# Patient Record
Sex: Male | Born: 1985 | Race: Black or African American | Hispanic: No | Marital: Single | Smoking: Current every day smoker
Health system: Southern US, Community
[De-identification: ages and names within clinical notes are randomized; demographics above are authoritative.]

## PROBLEM LIST (undated history)

## (undated) DIAGNOSIS — K649 Unspecified hemorrhoids: Secondary | ICD-10-CM

## (undated) DIAGNOSIS — F419 Anxiety disorder, unspecified: Secondary | ICD-10-CM

## (undated) DIAGNOSIS — G43909 Migraine, unspecified, not intractable, without status migrainosus: Secondary | ICD-10-CM

## (undated) HISTORY — PX: HEMORRHOID SURGERY: SHX153

---
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
## 2006-03-30 ENCOUNTER — Emergency Department (HOSPITAL_COMMUNITY): Admission: EM | Admit: 2006-03-30 | Discharge: 2006-03-30 | Payer: Self-pay | Admitting: Emergency Medicine

## 2007-02-13 ENCOUNTER — Emergency Department (HOSPITAL_COMMUNITY): Admission: EM | Admit: 2007-02-13 | Discharge: 2007-02-13 | Payer: Self-pay | Admitting: Emergency Medicine

## 2007-11-22 ENCOUNTER — Emergency Department (HOSPITAL_COMMUNITY): Admission: EM | Admit: 2007-11-22 | Discharge: 2007-11-22 | Payer: Self-pay | Admitting: Emergency Medicine

## 2008-01-08 ENCOUNTER — Encounter (INDEPENDENT_AMBULATORY_CARE_PROVIDER_SITE_OTHER): Payer: Self-pay | Admitting: General Surgery

## 2008-01-08 ENCOUNTER — Ambulatory Visit (HOSPITAL_COMMUNITY): Admission: EM | Admit: 2008-01-08 | Discharge: 2008-01-08 | Payer: Self-pay | Admitting: Emergency Medicine

## 2008-06-03 ENCOUNTER — Emergency Department (HOSPITAL_COMMUNITY): Admission: EM | Admit: 2008-06-03 | Discharge: 2008-06-03 | Payer: Self-pay | Admitting: Emergency Medicine

## 2008-12-02 ENCOUNTER — Emergency Department (HOSPITAL_COMMUNITY): Admission: EM | Admit: 2008-12-02 | Discharge: 2008-12-03 | Payer: Self-pay | Admitting: Emergency Medicine

## 2009-07-12 ENCOUNTER — Emergency Department (HOSPITAL_COMMUNITY): Admission: EM | Admit: 2009-07-12 | Discharge: 2009-07-12 | Payer: Self-pay | Admitting: Emergency Medicine

## 2009-09-13 ENCOUNTER — Emergency Department (HOSPITAL_COMMUNITY): Admission: EM | Admit: 2009-09-13 | Discharge: 2009-09-13 | Payer: Self-pay | Admitting: Emergency Medicine

## 2010-08-14 LAB — COMPREHENSIVE METABOLIC PANEL
ALT: 15 U/L (ref 0–53)
AST: 21 U/L (ref 0–37)
Alkaline Phosphatase: 52 U/L (ref 39–117)
BUN: 10 mg/dL (ref 6–23)
Calcium: 9.5 mg/dL (ref 8.4–10.5)
GFR calc Af Amer: >60 mL/min (ref >60)
GFR calc non Af Amer: >60 mL/min (ref >60)
Potassium: 3.5 mEq/L (ref 3.5–5.1)
Total Bilirubin: 0.3 mg/dL (ref 0.3–1.2)

## 2010-08-14 LAB — DIFFERENTIAL
Basophils Absolute: 0 10*3/uL (ref 0.0–0.1)
Eosinophils Absolute: 0.4 10*3/uL (ref 0.0–0.7)
Eosinophils Relative: 3 % (ref 0–5)
Lymphocytes Relative: 23 % (ref 12–46)
Lymphs Abs: 3.1 10*3/uL (ref 0.7–4.0)
Monocytes Absolute: 0.6 10*3/uL (ref 0.1–1.0)
Neutro Abs: 9.3 10*3/uL — ABNORMAL HIGH (ref 1.7–7.7)
Neutrophils Relative %: 69 % (ref 43–77)

## 2010-08-14 LAB — CBC
HCT: 40.8 % (ref 39.0–52.0)
MCV: 100.4 fL — ABNORMAL HIGH (ref 78.0–100.0)
Platelets: 270 10*3/uL (ref 150–400)
RDW: 13.2 % (ref 11.5–15.5)

## 2010-08-14 LAB — URINE CULTURE

## 2010-08-14 LAB — URINALYSIS, ROUTINE W REFLEX MICROSCOPIC

## 2010-08-14 LAB — RAPID URINE DRUG SCREEN, HOSP PERFORMED
Amphetamines: NOT DETECTED
Benzodiazepines: NOT DETECTED
Cocaine: NOT DETECTED

## 2010-08-14 LAB — URINE MICROSCOPIC-ADD ON

## 2010-08-14 LAB — ETHANOL: Alcohol, Ethyl (B): <5 mg/dL (ref 0–10)

## 2010-08-22 LAB — BASIC METABOLIC PANEL
CO2: 17 mEq/L — ABNORMAL LOW (ref 19–32)
Chloride: 99 mEq/L (ref 96–112)
Creatinine, Ser: 1.38 mg/dL (ref 0.4–1.5)
GFR calc Af Amer: >60 mL/min (ref >60)
GFR calc non Af Amer: >60 mL/min (ref >60)
Glucose, Bld: 81 mg/dL (ref 70–99)
Potassium: 4.3 mEq/L (ref 3.5–5.1)
Sodium: 135 mEq/L (ref 135–145)

## 2010-08-22 LAB — DIFFERENTIAL
Basophils Relative: 0 % (ref 0–1)
Eosinophils Absolute: 0.1 10*3/uL (ref 0.0–0.7)
Lymphocytes Relative: 25 % (ref 12–46)
Lymphs Abs: 3 10*3/uL (ref 0.7–4.0)
Monocytes Relative: 6 % (ref 3–12)

## 2010-08-22 LAB — CBC
Platelets: 271 10*3/uL (ref 150–400)
WBC: 12.1 10*3/uL — ABNORMAL HIGH (ref 4.0–10.5)

## 2010-08-22 LAB — URINALYSIS, ROUTINE W REFLEX MICROSCOPIC
Glucose, UA: NEGATIVE mg/dL
Ketones, ur: >80 mg/dL — AB
Specific Gravity, Urine: >1.03 — ABNORMAL HIGH (ref 1.005–1.030)

## 2010-08-22 LAB — URINE MICROSCOPIC-ADD ON

## 2010-09-20 NOTE — Op Note (Signed)
Jesus Adams, Jesus Adams                 ACCOUNT NO.:  [id number]   MEDICAL RECORD NO.:  [id number]          PATIENT TYPE:  AMB   LOCATION:  DAY                           FACILITY:  [facility]   PHYSICIAN:  Dalia Heading, M.D.  DATE OF BIRTH:  12-23-1985   DATE OF PROCEDURE:  01/08/2008  DATE OF DISCHARGE:                               OPERATIVE REPORT   PREOPERATIVE DIAGNOSIS:  Protruding hemorrhoidal disease.   POSTOPERATIVE DIAGNOSIS:  Protruding hemorrhoidal disease.   PROCEDURE:  Extensive hemorrhoidectomy.   SURGEON:  Dalia Heading, MD   ANESTHESIA:  General endotracheal.   INDICATIONS:  The patient is a 25 year old black male who presents with  a 1-week history of worsening prolapsing hemorrhoidal disease.  He  states he has a history of constipation in the past.  The risks and  benefits of the procedure including bleeding, infection, and recurrence  of hemorrhoidal disease were fully explained to the patient, gave  informed consent.   PROCEDURE NOTE:  The patient was placed in the lithotomy position after  induction of general endotracheal anesthesia.  The perineum was prepped  and draped using the usual sterile technique with Betadine.  Surgical  site confirmation was performed.   The patient had significant circumferential prolapsing hemorrhoidal  disease as well as some mucosal prolapse.  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  Using LigaSure, a circumferential hemorrhoidectomy  was performed along with mucosal resection.  Any loose edges were then  reapproximated using 2-0 Vicryl interrupted sutures.  Care was taken to  avoid the external sphincter mechanism.  The surrounding perineum was  instilled with 0.5% Sensorcaine.  Surgicel and viscous Xylocaine packing  was then placed into the rectum.   All tape and needle counts were correct at the end of the procedure.  The patient was  extubated in the operating room, went back to recovery  room awake in stable condition.   COMPLICATIONS:  None.   SPECIMEN:  Hemorrhoids.   ESTIMATED BLOOD LOSS:  Minimal.      Dalia Heading, M.D.  Electronically Signed     MAJ/MEDQ  D:  01/08/2008  T:  01/09/2008  Job:  [id number]

## 2010-10-09 ENCOUNTER — Emergency Department (HOSPITAL_COMMUNITY): Payer: Self-pay

## 2010-10-09 ENCOUNTER — Emergency Department (HOSPITAL_COMMUNITY)
Admission: EM | Admit: 2010-10-09 | Discharge: 2010-10-09 | Disposition: A | Discharge status: Home / Self Care | Payer: Self-pay | Attending: Emergency Medicine | Admitting: Emergency Medicine

## 2010-10-09 DIAGNOSIS — N390 Urinary tract infection, site not specified: Secondary | ICD-10-CM | POA: Insufficient documentation

## 2010-10-09 DIAGNOSIS — F17200 Nicotine dependence, unspecified, uncomplicated: Secondary | ICD-10-CM | POA: Insufficient documentation

## 2010-10-09 LAB — CBC
HCT: 40.2 % (ref 39.0–52.0)
Hemoglobin: 13.8 g/dL (ref 13.0–17.0)
MCHC: 34.3 g/dL (ref 30.0–36.0)
MCV: 94.1 fL (ref 78.0–100.0)
RBC: 4.27 MIL/uL (ref 4.22–5.81)
RDW: 13.2 % (ref 11.5–15.5)
WBC: 9.3 10*3/uL (ref 4.0–10.5)

## 2010-10-09 LAB — URINALYSIS, ROUTINE W REFLEX MICROSCOPIC
Bilirubin Urine: NEGATIVE
Glucose, UA: NEGATIVE mg/dL
Leukocytes, UA: NEGATIVE
Specific Gravity, Urine: 1.02 (ref 1.005–1.030)
pH: 6.5 (ref 5.0–8.0)

## 2010-10-09 LAB — COMPREHENSIVE METABOLIC PANEL
Alkaline Phosphatase: 72 U/L (ref 39–117)
BUN: 11 mg/dL (ref 6–23)
CO2: 29 mEq/L (ref 19–32)
Calcium: 10 mg/dL (ref 8.4–10.5)
GFR calc non Af Amer: >60 mL/min (ref >60)
Glucose, Bld: 79 mg/dL (ref 70–99)
Total Bilirubin: 0.4 mg/dL (ref 0.3–1.2)

## 2010-10-09 LAB — DIFFERENTIAL
Basophils Absolute: 0 10*3/uL (ref 0.0–0.1)
Eosinophils Relative: 5 % (ref 0–5)
Monocytes Absolute: 0.4 10*3/uL (ref 0.1–1.0)
Neutrophils Relative %: 50 % (ref 43–77)

## 2010-10-09 LAB — URINE MICROSCOPIC-ADD ON

## 2010-10-11 LAB — URINE CULTURE: Culture: NO GROWTH

## 2010-11-12 ENCOUNTER — Emergency Department (HOSPITAL_COMMUNITY)
Admission: EM | Admit: 2010-11-12 | Discharge: 2010-11-12 | Discharge status: Against Medical Advice | Payer: Self-pay | Attending: Emergency Medicine | Admitting: Emergency Medicine

## 2010-11-12 DIAGNOSIS — Z0389 Encounter for observation for other suspected diseases and conditions ruled out: Secondary | ICD-10-CM | POA: Insufficient documentation

## 2010-11-24 ENCOUNTER — Emergency Department: Payer: Self-pay | Admitting: Emergency Medicine

## 2011-02-03 LAB — RAPID URINE DRUG SCREEN, HOSP PERFORMED
Amphetamines: NOT DETECTED
Benzodiazepines: NOT DETECTED
Tetrahydrocannabinol: POSITIVE — AB

## 2011-02-03 LAB — COMPREHENSIVE METABOLIC PANEL
ALT: 15
AST: 20
Alkaline Phosphatase: 61
CO2: 21
Chloride: 109
Creatinine, Ser: 0.99
GFR calc Af Amer: >60
GFR calc non Af Amer: >60
Potassium: 3.8
Sodium: 141
Total Bilirubin: 0.6

## 2011-02-03 LAB — URINALYSIS, ROUTINE W REFLEX MICROSCOPIC
Bilirubin Urine: NEGATIVE
Ketones, ur: NEGATIVE
Nitrite: NEGATIVE
Urobilinogen, UA: 0.2

## 2011-02-03 LAB — DIFFERENTIAL
Basophils Absolute: 0
Basophils Relative: 0
Eosinophils Absolute: 0.4
Eosinophils Relative: 4

## 2011-02-03 LAB — LIPASE, BLOOD: Lipase: 20

## 2011-02-03 LAB — CBC
MCV: 98
RBC: 4.52
WBC: 9

## 2011-02-08 LAB — COMPREHENSIVE METABOLIC PANEL
ALT: 13
AST: 15
Alkaline Phosphatase: 51
CO2: 28
Calcium: 9.3
GFR calc Af Amer: >60
Glucose, Bld: 84
Potassium: 3.9
Sodium: 143
Total Protein: 6.7

## 2011-02-08 LAB — CBC
Hemoglobin: 14.5
RBC: 4.27
RDW: 14.3

## 2011-02-08 LAB — DIFFERENTIAL
Basophils Relative: 1
Eosinophils Absolute: 0.5
Eosinophils Relative: 4
Lymphs Abs: 6.1 — ABNORMAL HIGH
Monocytes Absolute: 0.6
Monocytes Relative: 5
Neutrophils Relative %: 39 — ABNORMAL LOW

## 2011-12-06 ENCOUNTER — Emergency Department (HOSPITAL_COMMUNITY)
Admission: EM | Admit: 2011-12-06 | Discharge: 2011-12-07 | Disposition: A | Discharge status: Home / Self Care | Payer: Self-pay | Attending: Emergency Medicine | Admitting: Emergency Medicine

## 2011-12-06 ENCOUNTER — Encounter (HOSPITAL_COMMUNITY): Payer: Self-pay | Admitting: *Deleted

## 2011-12-06 DIAGNOSIS — F17200 Nicotine dependence, unspecified, uncomplicated: Secondary | ICD-10-CM | POA: Insufficient documentation

## 2011-12-06 DIAGNOSIS — R51 Headache: Secondary | ICD-10-CM | POA: Insufficient documentation

## 2011-12-06 NOTE — ED Notes (Signed)
Pt reports having headache while at work, someone gave him 3 pills for the headache & has become worse & now having nausea. Pt does not know what the 3 pills were he took.

## 2011-12-07 MED ORDER — KETOROLAC TROMETHAMINE 30 MG/ML IJ SOLN
30.0000 mg | Freq: Once | INTRAMUSCULAR | Status: AC
  Administered 2011-12-07: 30 mg via INTRAVENOUS
  Filled 2011-12-07: qty 1

## 2011-12-07 MED ORDER — DIPHENHYDRAMINE HCL 50 MG/ML IJ SOLN
25.0000 mg | Freq: Once | INTRAMUSCULAR | Status: AC
  Administered 2011-12-07: 25 mg via INTRAVENOUS
  Filled 2011-12-07: qty 1

## 2011-12-07 MED ORDER — SODIUM CHLORIDE 0.9 % IV BOLUS (SEPSIS)
1000.0000 mL | Freq: Once | INTRAVENOUS | Status: AC
  Administered 2011-12-07: 1000 mL via INTRAVENOUS

## 2011-12-07 MED ORDER — ONDANSETRON HCL 4 MG/2ML IJ SOLN
4.0000 mg | Freq: Once | INTRAMUSCULAR | Status: AC
  Administered 2011-12-07: 4 mg via INTRAVENOUS
  Filled 2011-12-07: qty 2

## 2011-12-07 MED ORDER — HYDROMORPHONE HCL PF 1 MG/ML IJ SOLN
1.0000 mg | Freq: Once | INTRAMUSCULAR | Status: AC
  Administered 2011-12-07: 1 mg via INTRAVENOUS
  Filled 2011-12-07: qty 1

## 2011-12-07 NOTE — ED Provider Notes (Signed)
History     CSN: [id number]  Arrival date & time 12/06/11  2327   First MD Initiated Contact with Patient 12/07/11 0014      Chief Complaint  Patient presents with  . Headache  . Nausea    (Consider location/radiation/quality/duration/timing/severity/associated sxs/prior treatment) HPI  Jesus Adams is a 26 y.o. male who presents to the Emergency Department complaining of global headache that began while at work tonight. He has taken excedrin without relief. He took tylenol and began to vomit. Has had several episodes of vomiting. Denies vision changes, hearing changes, stiff neck, fever chills.    History reviewed. No pertinent past medical history.  History reviewed. No pertinent past surgical history.  History reviewed. No pertinent family history.  History  Substance Use Topics  . Smoking status: Current Everyday Smoker -- 1.0 packs/day    Types: Cigarettes  . Smokeless tobacco: Not on file  . Alcohol Use: No      Review of Systems  Constitutional: Negative for fever.       10 Systems reviewed and are negative for acute change except as noted in the HPI.  HENT: Negative for congestion.   Eyes: Negative for discharge and redness.  Respiratory: Negative for cough and shortness of breath.   Cardiovascular: Negative for chest pain.  Gastrointestinal: Positive for nausea and vomiting. Negative for abdominal pain.  Musculoskeletal: Negative for back pain.  Skin: Negative for rash.  Neurological: Positive for headaches. Negative for syncope and numbness.  Psychiatric/Behavioral:       No behavior change.    Allergies  Penicillins  Home Medications  No current outpatient prescriptions on file.  BP 126/77  Pulse 66  Temp 97.5 F (36.4 C) (Oral)  Resp 16  Ht 5\' 3"  (1.6 m)  Wt 140 lb (63.504 kg)  BMI 24.80 kg/m2  SpO2 100%  Physical Exam  Nursing note and vitals reviewed. Constitutional: He is oriented to person, place, and time.       Awake, alert,  nontoxic appearance with baseline speech for patient.  HENT:  Head: Atraumatic.  Mouth/Throat: No oropharyngeal exudate.  Eyes: EOM are normal. Pupils are equal, round, and reactive to light. Right eye exhibits no discharge. Left eye exhibits no discharge.  Neck: Neck supple.  Cardiovascular: Normal rate and regular rhythm.   No murmur heard. Pulmonary/Chest: Effort normal and breath sounds normal. No stridor. No respiratory distress. He has no wheezes. He has no rales. He exhibits no tenderness.  Abdominal: Soft. Bowel sounds are normal. He exhibits no mass. There is no tenderness. There is no rebound.  Musculoskeletal: He exhibits no tenderness.       Baseline ROM, moves extremities with no obvious new focal weakness.  Lymphadenopathy:    He has no cervical adenopathy.  Neurological: He is alert and oriented to person, place, and time.       Awake, alert, cooperative and aware of situation; motor strength bilaterally; sensation normal to light touch bilaterally; peripheral visual fields full to confrontation; no facial asymmetry; tongue midline; major cranial nerves appear intact; no pronator drift, normal finger to nose bilaterally, baseline gait without new ataxia.  Skin: No rash noted.  Psychiatric: He has a normal mood and affect.    ED Course  Procedures (including critical care time)  0145 Patient state headache has been relieved.   MDM  Patient presents with a headache that began earlier at work. Given IVF, analgesic, antiinflammatory, antiemetic and benadryl with relief.  Pt feels improved after  observation and/or treatment in ED.Pt stable in ED with no significant deterioration in condition.The patient appears reasonably screened and/or stabilized for discharge and I doubt any other medical condition or other [facility] requiring further screening, evaluation, or treatment in the ED at this time prior to discharge.  MDM Reviewed: nursing note and vitals          Nicoletta Dress.  Colon Branch, MD 12/07/11 [phone]

## 2012-02-27 ENCOUNTER — Emergency Department: Payer: Self-pay | Admitting: Emergency Medicine

## 2012-02-27 LAB — CBC WITH DIFFERENTIAL/PLATELET
Basophil #: 0 10*3/uL (ref 0.0–0.1)
Eosinophil #: 0.5 10*3/uL (ref 0.0–0.7)
HCT: 40.8 % (ref 40.0–52.0)
Lymphocyte %: 52.5 %
MCHC: 33.6 g/dL (ref 32.0–36.0)
Monocyte %: 5.8 %
Neutrophil #: 4.9 10*3/uL (ref 1.4–6.5)
RDW: 13 % (ref 11.5–14.5)

## 2012-09-04 ENCOUNTER — Emergency Department (HOSPITAL_COMMUNITY)
Admission: EM | Admit: 2012-09-04 | Discharge: 2012-09-04 | Disposition: A | Discharge status: Home / Self Care | Payer: Self-pay | Attending: Emergency Medicine | Admitting: Emergency Medicine

## 2012-09-04 ENCOUNTER — Encounter (HOSPITAL_COMMUNITY): Payer: Self-pay

## 2012-09-04 DIAGNOSIS — F17200 Nicotine dependence, unspecified, uncomplicated: Secondary | ICD-10-CM | POA: Insufficient documentation

## 2012-09-04 DIAGNOSIS — Y929 Unspecified place or not applicable: Secondary | ICD-10-CM | POA: Insufficient documentation

## 2012-09-04 DIAGNOSIS — S61219A Laceration without foreign body of unspecified finger without damage to nail, initial encounter: Secondary | ICD-10-CM

## 2012-09-04 DIAGNOSIS — R51 Headache: Secondary | ICD-10-CM | POA: Insufficient documentation

## 2012-09-04 DIAGNOSIS — Z23 Encounter for immunization: Secondary | ICD-10-CM | POA: Insufficient documentation

## 2012-09-04 DIAGNOSIS — Z8679 Personal history of other diseases of the circulatory system: Secondary | ICD-10-CM | POA: Insufficient documentation

## 2012-09-04 DIAGNOSIS — Y939 Activity, unspecified: Secondary | ICD-10-CM | POA: Insufficient documentation

## 2012-09-04 DIAGNOSIS — S61209A Unspecified open wound of unspecified finger without damage to nail, initial encounter: Secondary | ICD-10-CM | POA: Insufficient documentation

## 2012-09-04 DIAGNOSIS — W268XXA Contact with other sharp object(s), not elsewhere classified, initial encounter: Secondary | ICD-10-CM | POA: Insufficient documentation

## 2012-09-04 HISTORY — DX: Unspecified hemorrhoids: K64.9

## 2012-09-04 HISTORY — DX: Migraine, unspecified, not intractable, without status migrainosus: G43.909

## 2012-09-04 MED ORDER — TETANUS-DIPHTH-ACELL PERTUSSIS 5-2.5-18.5 LF-MCG/0.5 IM SUSP
INTRAMUSCULAR | Status: AC
  Administered 2012-09-04: 0.5 mL via INTRAMUSCULAR
  Filled 2012-09-04: qty 0.5

## 2012-09-04 MED ORDER — ONDANSETRON HCL 4 MG PO TABS
4.0000 mg | ORAL_TABLET | Freq: Once | ORAL | Status: AC
  Administered 2012-09-04: 4 mg via ORAL
  Filled 2012-09-04: qty 1

## 2012-09-04 MED ORDER — HYDROCODONE-ACETAMINOPHEN 5-325 MG PO TABS
2.0000 | ORAL_TABLET | Freq: Once | ORAL | Status: AC
  Administered 2012-09-04: 2 via ORAL
  Filled 2012-09-04: qty 2

## 2012-09-04 MED ORDER — HYDROCODONE-ACETAMINOPHEN 5-325 MG PO TABS: 1.0000 | ORAL_TABLET | ORAL | Status: DC | PRN

## 2012-09-04 NOTE — ED Provider Notes (Signed)
History     CSN: [id number]  Arrival date & time 09/04/12  4098   First MD Initiated Contact with Patient 09/04/12 [phone]      Chief Complaint  Patient presents with  . Laceration    (Consider location/radiation/quality/duration/timing/severity/associated sxs/prior treatment) Patient is a 27 y.o. male presenting with skin laceration. The history is provided by the patient.  Laceration Location:  Finger Finger laceration location:  R middle finger Length (cm):  1.4 Depth:  Cutaneous Quality: avulsion   Bleeding: controlled   Time since incident:  4 hours Laceration mechanism:  Metal edge Pain details:    Quality:  Sharp   Severity:  Moderate   Timing:  Constant   Progression:  Worsening Foreign body present:  No foreign bodies Worsened by:  Nothing tried Ineffective treatments:  None tried Tetanus status:  Up to date   Past Medical History  Diagnosis Date  . Migraine   . Hemorrhoids     Past Surgical History  Procedure Laterality Date  . Hemorrhoid surgery      No family history on file.  History  Substance Use Topics  . Smoking status: Current Every Day Smoker -- 1.00 packs/day    Types: Cigarettes  . Smokeless tobacco: Not on file  . Alcohol Use: No      Review of Systems  Constitutional: Negative for activity change.       All ROS Neg except as noted in HPI  HENT: Negative for nosebleeds and neck pain.   Eyes: Negative for photophobia and discharge.  Respiratory: Negative for cough, shortness of breath and wheezing.   Cardiovascular: Negative for chest pain and palpitations.  Gastrointestinal: Negative for abdominal pain and blood in stool.  Genitourinary: Negative for dysuria, frequency and hematuria.  Musculoskeletal: Negative for back pain and arthralgias.  Skin: Negative.   Neurological: Positive for headaches. Negative for dizziness, seizures and speech difficulty.  Psychiatric/Behavioral: Negative for hallucinations and confusion.     Allergies  Penicillins and Tramadol  Home Medications  No current outpatient prescriptions on file.  BP 139/84  Pulse 90  Temp(Src) 98.3 F (36.8 C) (Oral)  Resp 18  SpO2 98%  Physical Exam  Nursing note and vitals reviewed. Constitutional: He is oriented to person, place, and time. He appears well-developed and well-nourished.  Non-toxic appearance.  HENT:  Head: Normocephalic.  Right Ear: Tympanic membrane and external ear normal.  Left Ear: Tympanic membrane and external ear normal.  Eyes: EOM and lids are normal. Pupils are equal, round, and reactive to light.  Neck: Normal range of motion. Neck supple. Carotid bruit is not present.  Cardiovascular: Normal rate, regular rhythm, normal heart sounds, intact distal pulses and normal pulses.   Pulmonary/Chest: Breath sounds normal. No respiratory distress.  Abdominal: Soft. Bowel sounds are normal. There is no tenderness. There is no guarding.  Musculoskeletal: Normal range of motion.       Right hand: Normal sensation noted. Normal strength noted.       Hands: Lymphadenopathy:       Head (right side): No submandibular adenopathy present.       Head (left side): No submandibular adenopathy present.    He has no cervical adenopathy.  Neurological: He is alert and oriented to person, place, and time. He has normal strength. No cranial nerve deficit or sensory deficit.  Skin: Skin is warm and dry.  Psychiatric: He has a normal mood and affect. His speech is normal.    ED Course  Procedures (including  critical care time)  Labs Reviewed - No data to display No results found.   No diagnosis found.    MDM  I have reviewed nursing notes, vital signs, and all appropriate lab and imaging results for this patient. Pt sustained a skin avulsion/shallow laceration of the right middle finger on a piece of metal. Bleeding controlled. This wound is not a candidate for suture repair. FROM of the fingers. No motor/sensory  deficit. Plan Telfa bandage. Pt to see his MD or return to ED if any signs of infection.       Kathie Dike, PA-C 09/04/12 [phone]

## 2012-09-04 NOTE — ED Provider Notes (Signed)
Medical screening examination/treatment/procedure(s) were performed by non-physician practitioner and as supervising physician I was immediately available for consultation/collaboration.    Vida Roller, MD 09/04/12 [phone]

## 2012-09-04 NOTE — ED Notes (Signed)
Pt accidentally cut tip of r middle finger on piece of tin.  Bleeding controlled.

## 2013-11-29 ENCOUNTER — Emergency Department (HOSPITAL_COMMUNITY)
Admission: EM | Admit: 2013-11-29 | Discharge: 2013-11-29 | Disposition: A | Discharge status: Home / Self Care | Payer: Self-pay | Attending: Emergency Medicine | Admitting: Emergency Medicine

## 2013-11-29 ENCOUNTER — Encounter (HOSPITAL_COMMUNITY): Payer: Self-pay | Admitting: Emergency Medicine

## 2013-11-29 DIAGNOSIS — Z9889 Other specified postprocedural states: Secondary | ICD-10-CM | POA: Insufficient documentation

## 2013-11-29 DIAGNOSIS — N342 Other urethritis: Secondary | ICD-10-CM | POA: Insufficient documentation

## 2013-11-29 DIAGNOSIS — R109 Unspecified abdominal pain: Secondary | ICD-10-CM | POA: Insufficient documentation

## 2013-11-29 DIAGNOSIS — F17200 Nicotine dependence, unspecified, uncomplicated: Secondary | ICD-10-CM | POA: Insufficient documentation

## 2013-11-29 DIAGNOSIS — Z8659 Personal history of other mental and behavioral disorders: Secondary | ICD-10-CM | POA: Insufficient documentation

## 2013-11-29 DIAGNOSIS — R21 Rash and other nonspecific skin eruption: Secondary | ICD-10-CM | POA: Insufficient documentation

## 2013-11-29 DIAGNOSIS — Z8679 Personal history of other diseases of the circulatory system: Secondary | ICD-10-CM | POA: Insufficient documentation

## 2013-11-29 DIAGNOSIS — Z880 Allergy status to penicillin: Secondary | ICD-10-CM | POA: Insufficient documentation

## 2013-11-29 HISTORY — DX: Anxiety disorder, unspecified: F41.9

## 2013-11-29 MED ORDER — ONDANSETRON 8 MG PO TBDP
8.0000 mg | ORAL_TABLET | Freq: Once | ORAL | Status: AC
  Administered 2013-11-29: 8 mg via ORAL
  Filled 2013-11-29: qty 1

## 2013-11-29 MED ORDER — AZITHROMYCIN 250 MG PO TABS
2000.0000 mg | ORAL_TABLET | Freq: Once | ORAL | Status: AC
  Administered 2013-11-29: 2000 mg via ORAL
  Filled 2013-11-29: qty 8

## 2013-11-29 NOTE — ED Notes (Addendum)
Pt states the pain in his groin started 3 days ago.  Pain is only during urination.  No blood or deposits noted in the urine.  Small rash noted near the tip of the penis that appeared at the same time as the pain with urination.   Pt states he is sexually active, and his last sexual interaction was 5 days ago.

## 2013-11-29 NOTE — Discharge Instructions (Signed)
°Emergency Department Resource Guide °1) Find a Doctor and Pay Out of Pocket °Although you won't have to find out who is covered by your insurance plan, it is a good idea to ask around and get recommendations. You will then need to call the office and see if the doctor you have chosen will accept you as a new patient and what types of options they offer for patients who are self-pay. Some doctors offer discounts or will set up payment plans for their patients who do not have insurance, but you will need to ask so you aren't surprised when you get to your appointment. ° °2) Contact Your Local Health Department °Not all health departments have doctors that can see patients for sick visits, but many do, so it is worth a call to see if yours does. If you don't know where your local health department is, you can check in your phone book. The CDC also has a tool to help you locate your state's health department, and many state websites also have listings of all of their local health departments. ° °3) Find a Walk-in Clinic °If your illness is not likely to be very severe or complicated, you may want to try a walk in clinic. These are popping up all over the country in pharmacies, drugstores, and shopping centers. They're usually staffed by nurse practitioners or physician assistants that have been trained to treat common illnesses and complaints. They're usually fairly quick and inexpensive. However, if you have serious medical issues or chronic medical problems, these are probably not your best option. ° °No Primary Care Doctor: °- Call [facility] Connect at  832-8000 - they can help you locate a primary care doctor that  accepts your insurance, provides certain services, etc. °- Physician Referral Service- 1-800-533-3463 ° °Chronic Pain Problems: °Organization         Address  Phone   Notes  °[facility]  (336) 297-2271 Patients need to be referred by their primary care doctor.  ° °Medication  Assistance: °Organization         Address  Phone   Notes  °[place] [facility] [address] °[place], [place] [address] (336) 641-8030 --Must be a resident of [place] °-- Must have NO insurance coverage whatsoever (no Medicaid/ Medicare, etc.) °-- The pt. MUST have a primary care doctor that directs their care regularly and follows them in the community °  °[facility]  (866) 331-1348   °[employer]  (888) 892-1162   ° °Agencies that provide inexpensive medical care: °Organization         Address  Phone   Notes  °[facility]  (336) 832-8035   °[facility]    (336) 832-7272   °[facility] [address] °[place], [place] [address] (336) 832-4777   °[facility] [place] [address], °[place] (336) 271-4999   °[facility]    (336) 373-0678   °[facility]    (336) 272-1050   °[facility] ° [address], [place] Phone:  (336) 832-4444, Fax:  (336) 832-4440 Hours of Operation:  9 am - 6 pm, M-F.  Also accepts Medicaid/Medicare and self-pay.  °[facility] ° [address], [place] Phone: (336) 832-3150, Fax: (336) [phone]. Hours of Operation:  8:30 am - 5:30 pm, M-F.  Also accepts Medicaid and self-pay.  °[facility] [place] [address], [place] Phone: (336) 878-6027   °[facility] [address], [place], [place] (336)723-1848, Ext. 123 Mondays & Thursdays: 7-9 AM.  First 15 patients are seen on a first come, first serve basis. °  ° °Medicaid-accepting [place] Providers: ° °Organization         Address  Phone   Notes  °[facility] [address], [place] (336) 641-2100 Also accepts self-pay patients.  °[facility] [address], [place] ° (336) 856-9996   °[facility] [address], [place]  (336) 288-8857   °[facility] [address] [place] [address], [place] (336) 299-7000   °Veita Bland [address], [place]  ° (336) 373-1557 Only accepts [place] Access Medicaid patients after they have their name applied to their card.  ° °Self-Pay (no insurance) in [place]: ° °Organization         Address  Phone   Notes  °Sickle Cell Patients, [facility] [address], [place] (336) 832-1970   °[facility] [address], [place] (336) 832-4400   °[facility] ° [address], [place] (336) 992-4800   °[facility]/Dr. Osei-Bonsu ° [address] [place] [address], [place] or [address], [place] (336) 841-8500 Phone number for both [place] and [place] locations is the same.  °[facility] [address], [place] (336) 299-0000   °[facility] [address] [place] [address], [place] or [address] (336) 852-7530 °(336) 878-2260   °[facility] [address], [place] (336) 350-1642, phone; (336) 294-5005, fax Sees patients 1st and 3rd Saturday of every month.  Must not qualify for public or private insurance (i.e. Medicaid, Medicare, [place] Health Choice, Veterans' Benefits) • Household income should be no more than 200% of the poverty level •The clinic cannot treat you if you are pregnant or think you are pregnant • Sexually transmitted diseases are not treated at the clinic.  ° ° °Dental Care: °Organization         Address  Phone  Notes  °[place] [facility] [address], [place] (336) 641-6152 Accepts children up to age 21 who are enrolled in Medicaid or [place] Health Choice; pregnant women with a Medicaid card; and children who have applied for Medicaid or [place] Health Choice, but were declined, whose parents can pay a reduced fee at time of service.  °[place]  [facility] [place]  [address], [place] (336) 641-7733 Accepts children up to age 21 who are enrolled in Medicaid or [place] Health Choice; pregnant women with a Medicaid card; and children who have applied for Medicaid or [place] Health Choice, but were declined, whose parents can pay a reduced fee at time of service.  °[facility] ° [address], [place] (336) 641-4533 Patients are seen by appointment only. Walk-ins are not accepted. [facility] will see patients 18 years of age and older. °Monday - Tuesday (8am-5pm) °Most Wednesdays (8:30-5pm) °$30 per visit, cash only  °[facility] ° [address], [place] (336) 641-4533 Patients are seen by appointment only. Walk-ins are not accepted. [facility] will see patients 18 years of age and older. °One   Wednesday Evening (Monthly: Volunteer Based).  $30 per visit, cash only  °[facility]  (919) 537-3737 for adults; Children under age 4, call Graduate Pediatric Dentistry at ([phone]) [phone]. Children aged 4-14, please call (919) [phone] to request a pediatric application. ° Dental services are provided in all areas of dental care including fillings, crowns and bridges, complete and partial dentures, implants, gum treatment, root canals, and extractions. Preventive care is also provided. Treatment is provided to both adults and children. °Patients are selected via a lottery and there is often a waiting list. °  °[facility] [address], °[place] ° (336) 763-8833 [email or web address] °  °[facility] [address], [place], [place] (336)723-1848, Ext. 123 Second and Fourth Thursday of each month, opens at 6:30 AM; Clinic ends at 9 AM.  Patients are seen on a first-come first-served basis, and a limited number are seen during each clinic.  ° °[facility] ° [address], [place], [place] (336) 723-7904    Eligibility Requirements °You must have lived in [place], [place], or [place] counties for at least the last three months. °  You cannot be eligible for state or federal sponsored healthcare insurance, including Veterans Administration, Medicaid, or Medicare. °  You generally cannot be eligible for healthcare insurance through your employer.  °  How to apply: °Eligibility screenings are held every Tuesday and Wednesday afternoon from 1:00 pm until 4:00 pm. You do not need an appointment for the interview!  °[facility] [address], [place], [place] 336-631-2330   °[facility]  336-342-8273   °[facility]  336-703-3100   °[facility]  336-570-6415   ° °Behavioral Health Resources in the Community: °Intensive Outpatient Programs °Organization         Address  Phone  Notes  °[facility] [address], [place], [place] 336-878-6098   °[facility] [address], [place], [place] 336-832-9800   °[facility] [address], [place], [place] ° 336-882-2125   °[facility] [address],  °[place], [place] 1-800-853-5163 or 336-641-4981   °Substance Abuse Resources °Organization         Address  Phone  Notes  °[facility]  336-882-2125   °[facility]  336-784-9470   °[facility]  336-285-9073   °[facility]  336-845-3988   °Residential & Outpatient Substance Abuse Program  1-800-659-3381   °Psychological Services °Organization         Address  Phone  Notes  °[facility]  336- 832-9600   °[facility]  336- 378-7881   °[facility] [address], [place] 1-800-853-5163 or 336-641-4981   ° °Mobile Crisis Teams °Organization         Address  Phone  Notes  °[facility], Mobile Crisis Care Unit  1-877-626-1772   °Assertive °[facility] ° [address]  [place], [place] 336-834-9664   °Sharon DeEsch [address] °[place] 336-554-5454   ° °Self-Help/Support Groups °Organization         Address  Phone             Notes  °[facility] of  - variety of support groups  336- 373-1402 Call for more information  °Narcotics Anonymous (NA), [facility] [address], °[place]  2 meetings at this location  ° °  Residential Treatment Programs Organization         Address  Phone  Notes  [facility] [address],    [place]  [phone]   [facility]  [address], [place] [phone], [place], [place] [phone]   [facility] [address], [place] [phone] Admissions: 8am-3pm M-F  [facility] [address]    [place], [place] [phone]   [facility] [address], [place], [place] [phone]   [facility] [address]  [place], [place] [phone]   [facility] Intensive Outpatient [address], [place], [place] [phone]   [facility] ([facility] [address]  [place], [place] [phone] or [phone]   [facility] [address] [place], [place] [phone] Accepts Medicaid  [facility] [address]  [place] [phone] Substance Abuse/Addiction Treatment   [facility]         Address  Phone  Notes  [facility]  360-012-5587(888) [phone]   Angie FavaJulie Brannon, PhD [address], [address] [place], [place]   [phone](336) (323)[phone] or [phone](336) [phone]   [facility]   [address] [place], [place] [phone](336) ([phone]) [phone]   [facility] [address], [place], [place] [phone](336) [phone] Insurance/Medicaid/sponsorship through [facility] [address]                                    [place], [place] [phone]) [phone] Therapy/tele-psych/case    [facility] [address]   [place], [place] [phone](336) [phone]    Dr. Lolly MustacheArfeen  [phone](336) ([phone]   [facility] [place]  [employer] [facility] 1) [address], [place] 2) [address], [place] 3)  [address], [place] [phone](336) [phone](336) [phone]  [phone](336) [phone]   [facility] Child Abuse Hotline [phone](336) [phone] or [phone](336) [phone] (After Hours)      Your gonorrhea and chlamydia culture is pending results, and you will receive a phone call in the next several days if it is positive.  However, you were treated empirically today with antibiotics for both gonorrhea and chlamydia. Call your regular medical doctor or the Health Department on Monday to schedule a follow up appointment within the next week for further STD testing. Wear condoms until your testing is completed..  Return to the Emergency Department immediately if worsening.

## 2013-11-29 NOTE — ED Notes (Signed)
Having problem when voiding in pelvic area.  Started couple days ago.  No blood noted.
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
## 2013-12-01 LAB — GC/CHLAMYDIA PROBE AMP
CT Probe RNA: NEGATIVE
GC PROBE AMP APTIMA: NEGATIVE

## 2015-08-27 ENCOUNTER — Encounter (HOSPITAL_COMMUNITY): Payer: Self-pay | Admitting: Emergency Medicine

## 2015-08-27 ENCOUNTER — Emergency Department (HOSPITAL_COMMUNITY): Payer: No Typology Code available for payment source

## 2015-08-27 ENCOUNTER — Emergency Department (HOSPITAL_COMMUNITY)
Admission: EM | Admit: 2015-08-27 | Discharge: 2015-08-27 | Disposition: A | Discharge status: Home / Self Care | Payer: Self-pay | Attending: Emergency Medicine | Admitting: Emergency Medicine

## 2015-08-27 DIAGNOSIS — Y939 Activity, unspecified: Secondary | ICD-10-CM | POA: Insufficient documentation

## 2015-08-27 DIAGNOSIS — S20211A Contusion of right front wall of thorax, initial encounter: Secondary | ICD-10-CM

## 2015-08-27 DIAGNOSIS — S30810A Abrasion of lower back and pelvis, initial encounter: Secondary | ICD-10-CM | POA: Insufficient documentation

## 2015-08-27 DIAGNOSIS — Y999 Unspecified external cause status: Secondary | ICD-10-CM | POA: Insufficient documentation

## 2015-08-27 DIAGNOSIS — S0083XA Contusion of other part of head, initial encounter: Secondary | ICD-10-CM

## 2015-08-27 DIAGNOSIS — T07XXXA Unspecified multiple injuries, initial encounter: Secondary | ICD-10-CM

## 2015-08-27 DIAGNOSIS — S50811A Abrasion of right forearm, initial encounter: Secondary | ICD-10-CM | POA: Insufficient documentation

## 2015-08-27 DIAGNOSIS — R109 Unspecified abdominal pain: Secondary | ICD-10-CM | POA: Insufficient documentation

## 2015-08-27 DIAGNOSIS — Y929 Unspecified place or not applicable: Secondary | ICD-10-CM | POA: Insufficient documentation

## 2015-08-27 DIAGNOSIS — F17210 Nicotine dependence, cigarettes, uncomplicated: Secondary | ICD-10-CM | POA: Insufficient documentation

## 2015-08-27 DIAGNOSIS — S60511A Abrasion of right hand, initial encounter: Secondary | ICD-10-CM | POA: Insufficient documentation

## 2015-08-27 DIAGNOSIS — M542 Cervicalgia: Secondary | ICD-10-CM | POA: Insufficient documentation

## 2015-08-27 DIAGNOSIS — S60512A Abrasion of left hand, initial encounter: Secondary | ICD-10-CM | POA: Insufficient documentation

## 2015-08-27 DIAGNOSIS — S01111A Laceration without foreign body of right eyelid and periocular area, initial encounter: Secondary | ICD-10-CM | POA: Insufficient documentation

## 2015-08-27 MED ORDER — BACITRACIN-NEOMYCIN-POLYMYXIN 400-5-5000 EX OINT
TOPICAL_OINTMENT | CUTANEOUS | Status: DC
Start: 2015-08-27 — End: 2015-08-28
  Filled 2015-08-27: qty 1

## 2015-08-27 MED ORDER — HYDROMORPHONE HCL 1 MG/ML IJ SOLN
1.0000 mg | Freq: Once | INTRAMUSCULAR | Status: AC
  Administered 2015-08-27: 1 mg via INTRAVENOUS
  Filled 2015-08-27: qty 1

## 2015-08-27 MED ORDER — CYCLOBENZAPRINE HCL 10 MG PO TABS: 10.0000 mg | ORAL_TABLET | Freq: Three times a day (TID) | ORAL | Status: DC

## 2015-08-27 MED ORDER — TRIPLE ANTIBIOTIC 3.5-400-5000 EX OINT
1.0000 "application " | TOPICAL_OINTMENT | Freq: Once | CUTANEOUS | Status: AC
  Administered 2015-08-27: 1 via TOPICAL

## 2015-08-27 MED ORDER — IOPAMIDOL (ISOVUE-300) INJECTION 61%
100.0000 mL | Freq: Once | INTRAVENOUS | Status: AC | PRN
  Administered 2015-08-27: 100 mL via INTRAVENOUS

## 2015-08-27 MED ORDER — HYDROCODONE-ACETAMINOPHEN 5-325 MG PO TABS: 1.0000 | ORAL_TABLET | ORAL | Status: DC | PRN

## 2015-08-27 MED ORDER — ONDANSETRON HCL 4 MG/2ML IJ SOLN
4.0000 mg | Freq: Once | INTRAMUSCULAR | Status: AC
  Administered 2015-08-27: 4 mg via INTRAVENOUS
  Filled 2015-08-27: qty 2
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
## 2016-10-11 ENCOUNTER — Encounter (HOSPITAL_COMMUNITY): Payer: Self-pay | Admitting: Emergency Medicine

## 2016-10-11 ENCOUNTER — Emergency Department (HOSPITAL_COMMUNITY)
Admission: EM | Admit: 2016-10-11 | Discharge: 2016-10-11 | Disposition: A | Discharge status: Home / Self Care | Payer: Self-pay | Attending: Emergency Medicine | Admitting: Emergency Medicine

## 2016-10-11 DIAGNOSIS — Y9389 Activity, other specified: Secondary | ICD-10-CM | POA: Insufficient documentation

## 2016-10-11 DIAGNOSIS — S60351A Superficial foreign body of right thumb, initial encounter: Secondary | ICD-10-CM | POA: Insufficient documentation

## 2016-10-11 DIAGNOSIS — W458XXA Other foreign body or object entering through skin, initial encounter: Secondary | ICD-10-CM | POA: Insufficient documentation

## 2016-10-11 DIAGNOSIS — Y929 Unspecified place or not applicable: Secondary | ICD-10-CM | POA: Insufficient documentation

## 2016-10-11 DIAGNOSIS — S6991XA Unspecified injury of right wrist, hand and finger(s), initial encounter: Secondary | ICD-10-CM

## 2016-10-11 DIAGNOSIS — Y999 Unspecified external cause status: Secondary | ICD-10-CM | POA: Insufficient documentation

## 2016-10-11 MED ORDER — LIDOCAINE HCL (PF) 2 % IJ SOLN
10.0000 mL | Freq: Once | INTRAMUSCULAR | Status: AC
  Administered 2016-10-11: 10 mL via INTRADERMAL
  Filled 2016-10-11: qty 10

## 2016-10-11 MED ORDER — BACITRACIN ZINC 500 UNIT/GM EX OINT
1.0000 "application " | TOPICAL_OINTMENT | Freq: Two times a day (BID) | CUTANEOUS | Status: DC
  Administered 2016-10-11: 1 via TOPICAL
  Filled 2016-10-11: qty 0.9

## 2016-10-11 MED ORDER — CEPHALEXIN 500 MG PO CAPS: 500.0000 mg | ORAL_CAPSULE | Freq: Four times a day (QID) | ORAL | 0 refills | Status: DC

## 2016-10-11 MED ORDER — CEPHALEXIN 500 MG PO CAPS
500.0000 mg | ORAL_CAPSULE | Freq: Once | ORAL | Status: AC
  Administered 2016-10-11: 500 mg via ORAL
  Filled 2016-10-11: qty 1

## 2016-10-11 NOTE — ED Triage Notes (Signed)
Pt reports went to remove fish from hook last night and reports hook "got stuck" in right thumb. Pt denies pain. No active bleeding noted. Pt denies pain but reports throbbing sensation in right thumb. Pt reports last tetanus shot x8-9 months ago.

## 2016-10-11 NOTE — Discharge Instructions (Signed)
Clean the finger with mild soap and water and keep it bandaged for a few days. Apply neosporin twice a day.  Ibuprofen if needed for pain.  Return here for any signs of infection

## 2016-10-11 NOTE — ED Provider Notes (Signed)
[facility]-EMERGENCY DEPT Provider Note   CSN: [id number] Arrival date & time: 10/11/16  0739     History   Chief Complaint Chief Complaint  Patient presents with  . Foreign Body    HPI DESTINY TRICKEY is a 31 y.o. male.  HPI  JIOVANI MCCAMMON is a 31 y.o. male who presents to the Emergency Department complaining of foreign body to the right thumb.  Pt has a fish hook embedded in the skin along the lateral aspect of the nail.  He has tried to remove the hook unsuccessfully.  Incident occurred nearly 12 hrs prior to ER arrival.  He describes a throbbing sensation to the thumb with small amt of bleeding initially.  He denies swelling or numbness.  Up to date on immunizations   Past Medical History:  Diagnosis Date  . Anxiety   . Hemorrhoids   . Migraine     There are no active problems to display for this patient.   Past Surgical History:  Procedure Laterality Date  . HEMORRHOID SURGERY         Home Medications    Prior to Admission medications   Medication Sig Start Date End Date Taking? Authorizing Provider  cyclobenzaprine (FLEXERIL) 10 MG tablet Take 1 tablet (10 mg total) by mouth 3 (three) times daily. 08/27/15   Ivery Quale, PA-C  HYDROcodone-acetaminophen (NORCO/VICODIN) 5-325 MG tablet Take 1 tablet by mouth every 4 (four) hours as needed. 08/27/15   Ivery Quale, PA-C    Family History History reviewed. No pertinent family history.  Social History Social History  Substance Use Topics  . Smoking status: Current Every Day Smoker    Packs/day: 1.00    Types: Cigarettes  . Smokeless tobacco: Never Used  . Alcohol use No     Allergies   Penicillins and Tramadol   Review of Systems Review of Systems  Constitutional: Negative for chills and fever.  Musculoskeletal: Negative for arthralgias and joint swelling.  Skin: Positive for wound.       Fish hook to right thumb  Neurological: Negative for weakness and numbness.  Hematological: Does not  bruise/bleed easily.  All other systems reviewed and are negative.    Physical Exam Updated Vital Signs BP (!) 127/92 (BP Location: Left Arm)   Pulse (!) 57   Temp 97.9 F (36.6 C) (Oral)   Resp 18   Ht 5\' 4"  (1.626 m)   Wt 59 kg (130 lb)   SpO2 99%   BMI 22.31 kg/m   Physical Exam  Constitutional: He is oriented to person, place, and time. He appears well-developed and well-nourished. No distress.  HENT:  Head: Normocephalic and atraumatic.  Cardiovascular: Normal rate, regular rhythm, normal heart sounds and intact distal pulses.   No murmur heard. Pulmonary/Chest: Effort normal and breath sounds normal. No respiratory distress.  Musculoskeletal: Normal range of motion. He exhibits no edema or tenderness.  Neurological: He is alert and oriented to person, place, and time. He exhibits normal muscle tone. Coordination normal.  Skin: Skin is warm. Capillary refill takes less than 2 seconds. Laceration noted.  Foreign body to skin along the lateral aspect of the right thumb.  No edema, erythema or active bleeding  Nursing note and vitals reviewed.    ED Treatments / Results  Labs (all labs ordered are listed, but only abnormal results are displayed) Labs Reviewed - No data to display  EKG  EKG Interpretation None       Radiology No results found.  Procedures .Foreign Body Removal Date/Time: 10/11/2016 8:30 AM Performed by: Pauline AusRIPLETT, Afshin Chrystal Authorized by: Pauline AusRIPLETT, Esgar Barnick  Consent: Verbal consent obtained. Risks and benefits: risks, benefits and alternatives were discussed Consent given by: patient Patient understanding: patient states understanding of the procedure being performed Patient identity confirmed: verbally with patient and arm band Time out: Immediately prior to procedure a "time out" was called to verify the correct patient, procedure, equipment, support staff and site/side marked as required. Body area: skin General location: upper  extremity Location details: right thumb Anesthesia: digital block  Anesthesia: Local Anesthetic: lidocaine 2% without epinephrine Anesthetic total: 3 mL  Sedation: Patient sedated: no Patient restrained: no Patient cooperative: yes Localization method: visualized Removal mechanism: hemostat Dressing: antibiotic ointment Tendon involvement: none Depth: subcutaneous Complexity: simple 1 objects recovered. Objects recovered: fish hook Post-procedure assessment: foreign body removed Patient tolerance: Patient tolerated the procedure well with no immediate complications   (including critical care time)    Medications Ordered in ED Medications  bacitracin ointment 1 application (not administered)  cephALEXin (KEFLEX) capsule 500 mg (not administered)  lidocaine (XYLOCAINE) 2 % injection 10 mL (10 mLs Intradermal Given by Other 10/11/16 [id number])     Initial Impression / Assessment and Plan / ED Course  I have reviewed the triage vital signs and the nursing notes.  Pertinent labs & imaging results that were available during my care of the patient were reviewed by me and considered in my medical decision making (see chart for details).     Fish hook removed completely.  Puncture site thoroughly irrigated with saline.  NV intact, no motor deficits.  Will start pt on abx.  Wound care instructions given, Rx for Keflex.  Return precautions discussed.   Final Clinical Impressions(s) / ED Diagnoses   Final diagnoses:  Fish hook injury of finger of right hand, initial encounter    New Prescriptions New Prescriptions   No medications on file     Pauline Ausriplett, Talor [place]rosiers, Cordelia Poche-C 10/12/16 [id number]    Bethann BerkshireZammit, Joseph, MD 10/17/16 1621

## 2017-09-03 ENCOUNTER — Emergency Department (HOSPITAL_COMMUNITY)
Admission: EM | Admit: 2017-09-03 | Discharge: 2017-09-03 | Disposition: A | Discharge status: Home / Self Care | Attending: Emergency Medicine | Admitting: Emergency Medicine

## 2017-09-03 ENCOUNTER — Encounter (HOSPITAL_COMMUNITY): Payer: Self-pay | Admitting: Emergency Medicine

## 2017-09-03 ENCOUNTER — Emergency Department (HOSPITAL_COMMUNITY)

## 2017-09-03 ENCOUNTER — Other Ambulatory Visit: Payer: Self-pay

## 2017-09-03 DIAGNOSIS — F17210 Nicotine dependence, cigarettes, uncomplicated: Secondary | ICD-10-CM | POA: Diagnosis not present

## 2017-09-03 DIAGNOSIS — Y929 Unspecified place or not applicable: Secondary | ICD-10-CM | POA: Insufficient documentation

## 2017-09-03 DIAGNOSIS — Y939 Activity, unspecified: Secondary | ICD-10-CM | POA: Insufficient documentation

## 2017-09-03 DIAGNOSIS — Z23 Encounter for immunization: Secondary | ICD-10-CM | POA: Diagnosis not present

## 2017-09-03 DIAGNOSIS — S61213A Laceration without foreign body of left middle finger without damage to nail, initial encounter: Secondary | ICD-10-CM | POA: Diagnosis not present

## 2017-09-03 DIAGNOSIS — S6992XA Unspecified injury of left wrist, hand and finger(s), initial encounter: Secondary | ICD-10-CM | POA: Diagnosis present

## 2017-09-03 DIAGNOSIS — Y999 Unspecified external cause status: Secondary | ICD-10-CM | POA: Diagnosis not present

## 2017-09-03 DIAGNOSIS — Y93B3 Activity, free weights: Secondary | ICD-10-CM | POA: Insufficient documentation

## 2017-09-03 DIAGNOSIS — W231XXA Caught, crushed, jammed, or pinched between stationary objects, initial encounter: Secondary | ICD-10-CM | POA: Insufficient documentation

## 2017-09-03 MED ORDER — TETANUS-DIPHTH-ACELL PERTUSSIS 5-2.5-18.5 LF-MCG/0.5 IM SUSP
0.5000 mL | Freq: Once | INTRAMUSCULAR | Status: AC
  Administered 2017-09-03: 0.5 mL via INTRAMUSCULAR
  Filled 2017-09-03: qty 0.5

## 2017-09-03 MED ORDER — POVIDONE-IODINE 10 % EX SOLN
CUTANEOUS | Status: AC
  Filled 2017-09-03: qty 15

## 2017-09-03 MED ORDER — ACETAMINOPHEN 325 MG PO TABS
650.0000 mg | ORAL_TABLET | Freq: Once | ORAL | Status: AC
  Administered 2017-09-03: 650 mg via ORAL
  Filled 2017-09-03: qty 2

## 2017-09-03 MED ORDER — BACITRACIN ZINC 500 UNIT/GM EX OINT
TOPICAL_OINTMENT | Freq: Once | CUTANEOUS | Status: AC
  Administered 2017-09-03: 1 via TOPICAL
  Filled 2017-09-03: qty 0.9

## 2017-09-03 MED ORDER — BUPIVACAINE HCL (PF) 0.25 % IJ SOLN
10.0000 mL | Freq: Once | INTRAMUSCULAR | Status: DC
  Filled 2017-09-03: qty 30

## 2017-09-03 NOTE — Discharge Instructions (Signed)
Keep the wound clean and dry for the first 24 hours. After that you may gently clean the wound with soap and water. Make sure to pat dry the wound before covering it with any dressing. You can use topical antibiotic ointment and bandage. Ice and elevate for pain relief.   You can take Tylenol or Ibuprofen as directed for pain. You can alternate Tylenol and Ibuprofen every 4 hours for additional pain relief.   Return to the Emergency Department, your primary care doctor, or the [facility] in 7 days for suture removal.   Monitor closely for any signs of infection. Return to the Emergency Department for any worsening redness/swelling of the area that begins to spread, drainage from the site, worsening pain, fever or any other worsening or concerning symptoms.    

## 2017-09-03 NOTE — ED Notes (Signed)
Report given to RN at [facility].

## 2017-09-03 NOTE — ED Provider Notes (Signed)
[facility] EMERGENCY DEPARTMENT Provider Note   CSN: [id number] Arrival date & time: 09/03/17  1941     History   Chief Complaint Chief Complaint  Patient presents with  . Hand Pain    HPI Jesus Adams is a 32 y.o. male who presents for evaluation of left third digit laceration.  Patient reports that he was using a dumbbell and states that double fell, causing it to hit on his hand.  Patient reports that he does have pain to the finger but is able to move the finger without any difficulty.  Patient unsure if his tetanus is up-to-date.  Patient denies any numbness/weakness.  The history is provided by the patient.    Past Medical History:  Diagnosis Date  . Anxiety   . Hemorrhoids   . Migraine     There are no active problems to display for this patient.   Past Surgical History:  Procedure Laterality Date  . HEMORRHOID SURGERY          Home Medications    Prior to Admission medications   Medication Sig Start Date End Date Taking? Authorizing Provider  cephALEXin (KEFLEX) 500 MG capsule Take 1 capsule (500 mg total) by mouth 4 (four) times daily. 10/11/16   Triplett, Tammy, PA-C  cyclobenzaprine (FLEXERIL) 10 MG tablet Take 1 tablet (10 mg total) by mouth 3 (three) times daily. 08/27/15   Ivery Quale, PA-C  HYDROcodone-acetaminophen (NORCO/VICODIN) 5-325 MG tablet Take 1 tablet by mouth every 4 (four) hours as needed. 08/27/15   Ivery Quale, PA-C    Family History History reviewed. No pertinent family history.  Social History Social History   Tobacco Use  . Smoking status: Current Every Day Smoker    Packs/day: 1.00    Types: Cigarettes  . Smokeless tobacco: Never Used  Substance Use Topics  . Alcohol use: No  . Drug use: No     Allergies   Penicillins and Tramadol   Review of Systems Review of Systems  Skin: Positive for wound.  Neurological: Negative for weakness and numbness.     Physical Exam Updated Vital Signs BP (!) 134/94 (BP  Location: Right Arm)   Pulse (!) 56   Temp 98 F (36.7 C) (Oral)   Resp 16   Ht  (1.626 m)   SpO2 100%   BMI 22.31 kg/m   Physical Exam  Constitutional: He appears well-developed and well-nourished.  HENT:  Head: Normocephalic and atraumatic.  Eyes: Conjunctivae and EOM are normal. Right eye exhibits no discharge. Left eye exhibits no discharge. No scleral icterus.  Cardiovascular:  Pulses:      Radial pulses are 2+ on the right side, and 2+ on the left side.  Pulmonary/Chest: Effort normal.  Musculoskeletal:  Tenderness palpation noted to the anterior aspect of the left third digit.  Flexion/extension of all 5 digits intact without difficulty.  Patient able to easily make a fist.  No tenderness palpation to palm or wrist.  No abnormalities of the right upper extremity.  Neurological: He is alert.  Sensation intact along major nerve distributions of BUE  Skin: Skin is warm and dry. Capillary refill takes less than 2 seconds.  Good distal cap refill. LUE is not dusky in appearance or cool to touch.  Psychiatric: He has a normal mood and affect. His speech is normal and behavior is normal.  Nursing note and vitals reviewed.    ED Treatments / Results  Labs (all labs ordered are listed, but only  abnormal results are displayed) Labs Reviewed - No data to display  EKG None  Radiology Dg Hand Complete Left  Result Date: 09/03/2017 CLINICAL DATA:  Crush injury to the left middle finger with laceration. Initial encounter. EXAM: LEFT HAND - COMPLETE 3+ VIEW COMPARISON:  None. FINDINGS: Soft tissue swelling without fracture or malalignment. No opaque foreign body. IMPRESSION: Soft tissue injury without fracture. Electronically Signed   By: Marnee Spring M.D.   On: 09/03/2017 20:57    Procedures .Marland KitchenLaceration Repair Date/Time: 09/03/2017 11:32 PM Performed by: Maxwell Caul, PA-C Authorized by: Maxwell Caul, PA-C   Consent:    Consent obtained:  Verbal   Consent  given by:  Patient   Risks discussed:  Infection, pain, retained foreign body, tendon damage, poor cosmetic result and poor wound healing Anesthesia (see MAR for exact dosages):    Anesthesia method:  Nerve block   Block needle gauge:  25 G   Block anesthetic:  Bupivacaine 0.25% w/o epi   Block injection procedure:  Anatomic landmarks identified, introduced needle, incremental injection and negative aspiration for blood   Block outcome:  Anesthesia achieved Laceration details:    Location:  Finger   Finger location:  L long finger   Length (cm):  1.5 Repair type:    Repair type:  Simple Pre-procedure details:    Preparation:  Patient was prepped and draped in usual sterile fashion and imaging obtained to evaluate for foreign bodies Exploration:    Hemostasis achieved with:  Direct pressure   Wound exploration: wound explored through full range of motion     Wound extent: no foreign bodies/material noted   Treatment:    Area cleansed with:  Betadine and saline   Amount of cleaning:  Extensive   Irrigation solution:  Sterile saline   Irrigation method:  Syringe   Visualized foreign bodies/material removed: no   Skin repair:    Repair method:  Sutures   Suture size:  5-0   Suture material:  Prolene   Suture technique:  Simple interrupted   Number of sutures:  5 Approximation:    Approximation:  Close Post-procedure details:    Dressing:  Sterile dressing and antibiotic ointment   Patient tolerance of procedure:  Tolerated well, no immediate complications Comments:     Once the wound was anesthetized, the area was thoroughly and extensively irrigated with sterile saline.  Full evaluation of the wound showed no evidence of tendon injury. No evidence of foreign body.   (including critical care time)  Medications Ordered in ED Medications  bupivacaine (PF) (MARCAINE) 0.25 % injection 10 mL (has no administration in time range)  povidone-iodine (BETADINE) 10 % external solution  (has no administration in time range)  Tdap (BOOSTRIX) injection 0.5 mL (0.5 mLs Intramuscular Given 09/03/17 2252)  bacitracin ointment (1 application Topical Given 09/03/17 2325)  acetaminophen (TYLENOL) tablet 650 mg (650 mg Oral Given 09/03/17 2324)     Initial Impression / Assessment and Plan / ED Course  I have reviewed the triage vital signs and the nursing notes.  Pertinent labs & imaging results that were available during my care of the patient were reviewed by me and considered in my medical decision making (see chart for details).     32 y.o. F who presents for evaluation of left 3rd digit laceration.  Patient reports this occurred after he dropped a dumbbell on his hand. Patient is afebrile, non-toxic appearing, sitting comfortably on examination table. Vital signs reviewed and stable. Patient  is neurovascularly intact.  On exam, patient has a 1.5 cm laceration to the anterior aspect of the left third digit that extends through the DIP joint.  Patient has good flexion/extension of the hand without any difficulty.  Consider fracture versus dislocation versus laceration.  X-rays ordered at triage.  X-rays reviewed.  Negative for any acute abnormality.  Discussed results with patient.  We will plan to repair.  Laceration is repaired as documented above.  Patient tolerated procedure well without any difficulty.  Discussed wound care precautions with patient. Patient had ample opportunity for questions and discussion. All patient's questions were answered with full understanding. Strict return precautions discussed. Patient expresses understanding and agreement to plan.   Final Clinical Impressions(s) / ED Diagnoses   Final diagnoses:  Laceration of left middle finger without foreign body without damage to nail, initial encounter    ED Discharge Orders    None       Rosana Hoes 09/03/17 2340    Mesner, Barbara Cower, MD 09/04/17 2249

## 2017-09-03 NOTE — ED Triage Notes (Signed)
Pt has crush injury to the left middle finger from dumbbell.

## 2020-11-03 ENCOUNTER — Emergency Department (HOSPITAL_COMMUNITY)
Admission: EM | Admit: 2020-11-03 | Discharge: 2020-11-03 | Disposition: A | Discharge status: Home / Self Care | Payer: Self-pay | Attending: Emergency Medicine | Admitting: Emergency Medicine

## 2020-11-03 ENCOUNTER — Emergency Department (HOSPITAL_COMMUNITY): Payer: Self-pay

## 2020-11-03 ENCOUNTER — Encounter (HOSPITAL_COMMUNITY): Payer: Self-pay

## 2020-11-03 ENCOUNTER — Other Ambulatory Visit: Payer: Self-pay

## 2020-11-03 DIAGNOSIS — Z8719 Personal history of other diseases of the digestive system: Secondary | ICD-10-CM | POA: Insufficient documentation

## 2020-11-03 DIAGNOSIS — K6289 Other specified diseases of anus and rectum: Secondary | ICD-10-CM | POA: Insufficient documentation

## 2020-11-03 DIAGNOSIS — K625 Hemorrhage of anus and rectum: Secondary | ICD-10-CM | POA: Diagnosis not present

## 2020-11-03 DIAGNOSIS — F17210 Nicotine dependence, cigarettes, uncomplicated: Secondary | ICD-10-CM | POA: Insufficient documentation

## 2020-11-03 LAB — COMPREHENSIVE METABOLIC PANEL WITH GFR
ALT: 18 U/L (ref 0–44)
AST: 20 U/L (ref 15–41)
Albumin: 4.1 g/dL (ref 3.5–5.0)
Alkaline Phosphatase: 70 U/L (ref 38–126)
Anion gap: 11 (ref 5–15)
BUN: 9 mg/dL (ref 6–20)
CO2: 22 mmol/L (ref 22–32)
Calcium: 9.4 mg/dL (ref 8.9–10.3)
Chloride: 103 mmol/L (ref 98–111)
Creatinine, Ser: 1.02 mg/dL (ref 0.61–1.24)
GFR, Estimated: >60 mL/min
Glucose, Bld: 77 mg/dL (ref 70–99)
Potassium: 4 mmol/L (ref 3.5–5.1)
Sodium: 136 mmol/L (ref 135–145)
Total Bilirubin: 0.5 mg/dL (ref 0.3–1.2)
Total Protein: 6.6 g/dL (ref 6.5–8.1)

## 2020-11-03 LAB — URINALYSIS, ROUTINE W REFLEX MICROSCOPIC
Bacteria, UA: NONE SEEN
Bilirubin Urine: NEGATIVE
Glucose, UA: NEGATIVE mg/dL
Ketones, ur: 20 mg/dL — AB
Leukocytes,Ua: NEGATIVE
Nitrite: NEGATIVE
Protein, ur: NEGATIVE mg/dL
Specific Gravity, Urine: 1.011 (ref 1.005–1.030)
pH: 5 (ref 5.0–8.0)

## 2020-11-03 LAB — CBC WITH DIFFERENTIAL/PLATELET
Abs Immature Granulocytes: 0.06 10*3/uL (ref 0.00–0.07)
Basophils Absolute: 0.1 10*3/uL (ref 0.0–0.1)
Basophils Relative: 1 %
Eosinophils Absolute: 0.1 10*3/uL (ref 0.0–0.5)
Eosinophils Relative: 1 %
HCT: 40.5 % (ref 39.0–52.0)
Hemoglobin: 14 g/dL (ref 13.0–17.0)
Immature Granulocytes: 1 %
Lymphocytes Relative: 26 %
Lymphs Abs: 3.1 10*3/uL (ref 0.7–4.0)
MCH: 32.2 pg (ref 26.0–34.0)
MCHC: 34.6 g/dL (ref 30.0–36.0)
MCV: 93.1 fL (ref 80.0–100.0)
Monocytes Absolute: 0.6 10*3/uL (ref 0.1–1.0)
Monocytes Relative: 5 %
Neutro Abs: 8 10*3/uL — ABNORMAL HIGH (ref 1.7–7.7)
Neutrophils Relative %: 66 %
Platelets: 275 10*3/uL (ref 150–400)
RBC: 4.35 MIL/uL (ref 4.22–5.81)
RDW: 12.2 % (ref 11.5–15.5)
WBC: 12 10*3/uL — ABNORMAL HIGH (ref 4.0–10.5)
nRBC: 0 % (ref 0.0–0.2)

## 2020-11-03 LAB — CBG MONITORING, ED: Glucose-Capillary: 83 mg/dL (ref 70–99)

## 2020-11-03 LAB — LIPASE, BLOOD: Lipase: 21 U/L (ref 11–51)

## 2020-11-03 MED ORDER — HYDROCODONE-ACETAMINOPHEN 5-325 MG PO TABS: 1.0000 | ORAL_TABLET | ORAL | 0 refills | Status: DC | PRN

## 2020-11-03 MED ORDER — PHENYLEPHRINE IN HARD FAT 0.25 % RE SUPP: 1.0000 | Freq: Two times a day (BID) | RECTAL | 0 refills | Status: DC

## 2020-11-03 MED ORDER — HYDROCODONE-ACETAMINOPHEN 5-325 MG PO TABS
1.0000 | ORAL_TABLET | Freq: Once | ORAL | Status: AC
Start: 2020-11-03 — End: 2020-11-03
  Administered 2020-11-03: 1 via ORAL
  Filled 2020-11-03: qty 1

## 2020-11-03 MED ORDER — OXYCODONE-ACETAMINOPHEN 5-325 MG PO TABS
1.0000 | ORAL_TABLET | Freq: Once | ORAL | Status: AC
Start: 2020-11-03 — End: 2020-11-03
  Administered 2020-11-03: 1 via ORAL
  Filled 2020-11-03: qty 1

## 2020-11-03 MED ORDER — ONDANSETRON 4 MG PO TBDP
4.0000 mg | ORAL_TABLET | Freq: Once | ORAL | Status: AC
  Administered 2020-11-03: 4 mg via ORAL
  Filled 2020-11-03: qty 1

## 2020-11-03 NOTE — ED Notes (Signed)
Pt is very agitated and wants to speak to triage or charge nurse. Both triage and charge nurse notified.

## 2020-11-03 NOTE — ED Notes (Signed)
Pt stepped outside.  

## 2020-11-03 NOTE — ED Triage Notes (Addendum)
Pt reports he was scheduled for a colonoscopy today and to have hemorrhoids banded, they stopped the procedure d/t pt coughing. Pt went home and began passing bright red blood, dark red blood with stool.

## 2020-11-03 NOTE — ED Notes (Signed)
Patient continuing to curse at ED staff. Off-duty called to deescalate. Patient agreed to move to other side of lobby. Girlfriend with patient in lobby.

## 2020-11-03 NOTE — ED Provider Notes (Signed)
[facility] EMERGENCY DEPARTMENT Provider Note   CSN: [id number] Arrival date & time: 11/03/20  1659     History Chief Complaint  Patient presents with   Rectal Bleeding    Jesus Adams is a 35 y.o. male.  Patient to ED with c/o severe rectal pain, and rectal bleeding. He reports known long-term h/o internal hemorrhoids, at least 8 years. He was seen at [facility] today for a colonoscopy/sigmoidoscopy for further evaluation and since has had significant rectal bleeding and pain. Chart reviewed. He states they were unable to complete the study today and stopped due to complications of anesthesia.   The history is provided by the patient. No language interpreter was used.  Rectal Bleeding Associated symptoms: no fever       Past Medical History:  Diagnosis Date   Anxiety    Hemorrhoids    Migraine     There are no problems to display for this patient.   Past Surgical History:  Procedure Laterality Date   HEMORRHOID SURGERY         No family history on file.  Social History   Tobacco Use   Smoking status: Every Day    Packs/day: 1.00    Pack years: 0.00    Types: Cigarettes   Smokeless tobacco: Never  Substance Use Topics   Alcohol use: No   Drug use: No    Home Medications Prior to Admission medications   Medication Sig Start Date End Date Taking? Authorizing Provider  cephALEXin (KEFLEX) 500 MG capsule Take 1 capsule (500 mg total) by mouth 4 (four) times daily. 10/11/16   Triplett, Tammy, PA-C  cyclobenzaprine (FLEXERIL) 10 MG tablet Take 1 tablet (10 mg total) by mouth 3 (three) times daily. 08/27/15   Ivery Quale, PA-C  HYDROcodone-acetaminophen (NORCO/VICODIN) 5-325 MG tablet Take 1 tablet by mouth every 4 (four) hours as needed. 08/27/15   Ivery Quale, PA-C    Allergies    Penicillins and Tramadol  Review of Systems   Review of Systems  Constitutional:  Negative for chills and fever.  HENT: Negative.    Respiratory:  Negative.    Cardiovascular: Negative.   Gastrointestinal:  Positive for anal bleeding, hematochezia and rectal pain.  Musculoskeletal: Negative.   Skin: Negative.   Neurological: Negative.    Physical Exam Updated Vital Signs BP 116/77   Pulse 67   Temp 99 F (37.2 C) (Oral)   Resp 18   SpO2 95%   Physical Exam  ED Results / Procedures / Treatments   Labs (all labs ordered are listed, but only abnormal results are displayed) Labs Reviewed  CBC WITH DIFFERENTIAL/PLATELET - Abnormal; Notable for the following components:      Result Value   WBC 12.0 (*)    Neutro Abs 8.0 (*)    All other components within normal limits  URINALYSIS, ROUTINE W REFLEX MICROSCOPIC - Abnormal; Notable for the following components:   Hgb urine dipstick MODERATE (*)    Ketones, ur 20 (*)    All other components within normal limits  LIPASE, BLOOD  COMPREHENSIVE METABOLIC PANEL  CBG MONITORING, ED   Results for orders placed or performed during the hospital encounter of 11/03/20  CBC with Differential  Result Value Ref Range   WBC 12.0 (H) 4.0 - 10.5 K/uL   RBC 4.35 4.22 - 5.81 MIL/uL   Hemoglobin 14.0 13.0 - 17.0 g/dL   HCT 20.2 54.2 - 70.6 %   MCV 93.1 80.0 -  100.0 fL   MCH 32.2 26.0 - 34.0 pg   MCHC 34.6 30.0 - 36.0 g/dL   RDW 46.9 62.9 - 52.8 %   Platelets 275 150 - 400 K/uL   nRBC 0.0 0.0 - 0.2 %   Neutrophils Relative % 66 %   Neutro Abs 8.0 (H) 1.7 - 7.7 K/uL   Lymphocytes Relative 26 %   Lymphs Abs 3.1 0.7 - 4.0 K/uL   Monocytes Relative 5 %   Monocytes Absolute 0.6 0.1 - 1.0 K/uL   Eosinophils Relative 1 %   Eosinophils Absolute 0.1 0.0 - 0.5 K/uL   Basophils Relative 1 %   Basophils Absolute 0.1 0.0 - 0.1 K/uL   Immature Granulocytes 1 %   Abs Immature Granulocytes 0.06 0.00 - 0.07 K/uL  Lipase, blood  Result Value Ref Range   Lipase 21 11 - 51 U/L  Comprehensive metabolic panel  Result Value Ref Range   Sodium 136 135 - 145 mmol/L   Potassium 4.0 3.5 - 5.1 mmol/L    Chloride 103 98 - 111 mmol/L   CO2 22 22 - 32 mmol/L   Glucose, Bld 77 70 - 99 mg/dL   BUN 9 6 - 20 mg/dL   Creatinine, Ser 4.13 0.61 - 1.24 mg/dL   Calcium 9.4 8.9 - 24.4 mg/dL   Total Protein 6.6 6.5 - 8.1 g/dL   Albumin 4.1 3.5 - 5.0 g/dL   AST 20 15 - 41 U/L   ALT 18 0 - 44 U/L   Alkaline Phosphatase 70 38 - 126 U/L   Total Bilirubin 0.5 0.3 - 1.2 mg/dL   GFR, Estimated >01 >02 mL/min   Anion gap 11 5 - 15  Urinalysis, Routine w reflex microscopic Urine, Clean Catch  Result Value Ref Range   Color, Urine YELLOW YELLOW   APPearance CLEAR CLEAR   Specific Gravity, Urine 1.011 1.005 - 1.030   pH 5.0 5.0 - 8.0   Glucose, UA NEGATIVE NEGATIVE mg/dL   Hgb urine dipstick MODERATE (A) NEGATIVE   Bilirubin Urine NEGATIVE NEGATIVE   Ketones, ur 20 (A) NEGATIVE mg/dL   Protein, ur NEGATIVE NEGATIVE mg/dL   Nitrite NEGATIVE NEGATIVE   Leukocytes,Ua NEGATIVE NEGATIVE   RBC / HPF 6-10 0 - 5 RBC/hpf   WBC, UA 0-5 0 - 5 WBC/hpf   Bacteria, UA NONE SEEN NONE SEEN   Squamous Epithelial / LPF 0-5 0 - 5  CBG monitoring, ED  Result Value Ref Range   Glucose-Capillary 83 70 - 99 mg/dL    EKG None  Radiology DG Abdomen Acute W/Chest  Result Date: 11/03/2020 CLINICAL DATA:  Colonoscopy, abdominal pain EXAM: DG ABDOMEN ACUTE WITH 1 VIEW CHEST COMPARISON:  None. FINDINGS: Gaseous distention of the colon. No organomegaly or free air. No suspicious calcification. Heart and mediastinal contours are within normal limits. No focal opacities or effusions. No acute bony abnormality. IMPRESSION: Gaseous distention of the colon, likely related to colonoscopy. No free air. No acute cardiopulmonary disease. Electronically Signed   By: Charlett Nose M.D.   On: 11/03/2020 19:44    Procedures Procedures   Medications Ordered in ED Medications  HYDROcodone-acetaminophen (NORCO/VICODIN) 5-325 MG per tablet 1 tablet (has no administration in time range)  ondansetron (ZOFRAN-ODT) disintegrating tablet 4 mg  (4 mg Oral Given 11/03/20 1921)  oxyCODONE-acetaminophen (PERCOCET/ROXICET) 5-325 MG per tablet 1 tablet (1 tablet Oral Given 11/03/20 1921)    ED Course  I have reviewed the triage vital signs and the nursing  notes.  Pertinent labs & imaging results that were available during my care of the patient were reviewed by me and considered in my medical decision making (see chart for details).    MDM Rules/Calculators/A&P                          Patient to ED with rectal bleeding, history of internal hemorrhoids, pain/bleeding worse after attempted sigmoidoscopy earlier today. No fever  Patient significantly tender around rectum. No external hemorrhoids. No active bleeding but there is red blood on his underwear. VSS. Labs reviewed and are essentially unremarkable.  Patient will need referral to surgery for further management of hemorrhoids. He reports he has trialed steroidal creams and suppositories, benzocaine topical for pain relief.  Will prescribe NTG ointment, encourage continuation of stool softeners, high fiber diet, steroidal suppositories.    Final Clinical Impression(s) / ED Diagnoses Final diagnoses:  None   Rectal pain History of internal hemorroids Rx / DC Orders ED Discharge Orders     None        Danne Harbor 11/03/20 2258    Virgina Norfolk, DO 11/03/20 2316

## 2020-11-03 NOTE — ED Notes (Signed)
Patient updated about wait time. Patient on phone cursing about wait time, threatening to "turn up" on staff.

## 2020-11-03 NOTE — ED Notes (Addendum)
Charged nurse talking with pt & pt's family.

## 2020-11-03 NOTE — Discharge Instructions (Addendum)
Continue your usual regimen of stool softeners, high fiber diet and supplements, especially when taking Norco for pain. Try the Rectacaine for additional relief.   Both [facility] and our emergency department have referred you for colorectal outpatient surgery. Make an appointment as soon as possible.

## 2020-11-03 NOTE — ED Notes (Signed)
PT returned to ED lobby

## 2020-11-04 ENCOUNTER — Encounter: Payer: Self-pay | Admitting: General Surgery

## 2020-11-04 ENCOUNTER — Ambulatory Visit (INDEPENDENT_AMBULATORY_CARE_PROVIDER_SITE_OTHER): Payer: No Typology Code available for payment source | Admitting: General Surgery

## 2020-11-04 VITALS — BP 120/76 | HR 75 | Temp 98.7°F | Resp 14 | Ht 65.0 in | Wt 127.6 lb

## 2020-11-04 DIAGNOSIS — K648 Other hemorrhoids: Secondary | ICD-10-CM | POA: Diagnosis not present

## 2020-11-04 NOTE — Patient Instructions (Signed)
Jesus Adams  11/04/2020     @PREFPERIOPPHARMACY @   Your procedure is scheduled on 11/09/2020.  Report to 01/10/2021 at 6:15 A.M.  Call this number if you have problems the morning of surgery:  [phone]   Remember:  Do not eat or drink after midnight.     Take these medicines the morning of surgery with A SIP OF WATER : Hydrocodone if needed    Do not wear jewelry, make-up or nail polish.  Do not wear lotions, powders, or perfumes, or deodorant.  Do not shave 48 hours prior to surgery.  Men may shave face and neck.  Do not bring valuables to the hospital.  [facility] is not responsible for any belongings or valuables.  Contacts, dentures or bridgework may not be worn into surgery.  Leave your suitcase in the car.  After surgery it may be brought to your room.  For patients admitted to the hospital, discharge time will be determined by your treatment team.  Patients discharged the day of surgery will not be allowed to drive home.   Name and phone number of your driver:   Family Special instructions:  N/A  Please read over the following fact sheets that you were given. Care and Recovery After Surgery    Surgical Procedures for Hemorrhoids, Care After This sheet gives you information about how to care for yourself after your procedure. Your health care provider may also give you more specific instructions. If you have problems or questions, contact your health careprovider. What can I expect after the procedure? After the procedure, it is common to have: Rectal pain. Pain when you are having a bowel movement. Slight rectal bleeding. This is more likely to happen with the first bowel movement after surgery. Follow these instructions at home: Medicines Take over-the-counter and prescription medicines only as told by your health care provider. If you were prescribed an antibiotic medicine, use it as told by your health care provider. Do not stop using the antibiotic  even if your condition improves. Ask your health care provider if the medicine prescribed to you requires you to avoid driving or using heavy machinery. Use a stool softener or a bulk laxative as told by your health care provider. Eating and drinking Follow instructions from your health care provider about what to eat or drink after your procedure. You may need to take actions to prevent or treat constipation, such as: Drink enough fluid to keep your urine pale yellow. Take over-the-counter or prescription medicines. Eat foods that are high in fiber, such as beans, whole grains, and fresh fruits and vegetables. Limit foods that are high in fat and processed sugars, such as fried or sweet foods. Activity  Rest as told by your health care provider. Avoid sitting for a long time without moving. Get up to take short walks every 1-2 hours. This is important to improve blood flow and breathing. Ask for help if you feel weak or unsteady. Return to your normal activities as told by your health care provider. Ask your health care provider what activities are safe for you. Do not lift anything that is heavier than 10 lb (4.5 kg), or the limit that you are told, until your health care provider says that it is safe. Do not strain to have a bowel movement. Do not spend a long time sitting on the toilet.  General instructions  Take warm sitz baths for 15-20 minutes, 2-3 times a day to relieve soreness or  itching and to keep the rectal area clean. Apply ice packs to the area to reduce swelling and pain. Do not drive for 24 hours if you were given a sedative during your procedure. Keep all follow-up visits as told by your health care provider. This is important.  Contact a health care provider if: Your pain medicine is not helping. You have a fever or chills. You have bad smelling drainage. You have a lot of swelling. You become constipated. You have trouble passing urine. Get help right away  if: You have very bad rectal pain. You have heavy bleeding from your rectum. Summary After the procedure, it is common to have pain and slight rectal bleeding. Take warm sitz baths for 15-20 minutes, 2-3 times a day to relieve soreness or itching and to keep the rectal area clean. Avoid straining when having a bowel movement. Eat foods that are high in fiber, such as beans, whole grains, and fresh fruits and vegetables. Take over-the-counter and prescription medicines only as told by your health care provider. This information is not intended to replace advice given to you by your health care provider. Make sure you discuss any questions you have with your healthcare provider. Document Revised: 10/09/2018 Document Reviewed: 03/12/2018 Elsevier Patient Education  2022 [employer]  General Anesthesia, Adult, Care After This sheet gives you information about how to care for yourself after your procedure. Your health care provider may also give you more specific instructions. If you have problems or questions, contact your health careprovider. What can I expect after the procedure? After the procedure, the following side effects are common: Pain or discomfort at the IV site. Nausea. Vomiting. Sore throat. Trouble concentrating. Feeling cold or chills. Feeling weak or tired. Sleepiness and fatigue. Soreness and body aches. These side effects can affect parts of the body that were not involved in surgery. Follow these instructions at home: For the time period you were told by your health care provider:  Rest. Do not participate in activities where you could fall or become injured. Do not drive or use machinery. Do not drink alcohol. Do not take sleeping pills or medicines that cause drowsiness. Do not make important decisions or sign legal documents. Do not take care of children on your own.  Eating and drinking Follow any instructions from your health care provider about eating or  drinking restrictions. When you feel hungry, start by eating small amounts of foods that are soft and easy to digest (bland), such as toast. Gradually return to your regular diet. Drink enough fluid to keep your urine pale yellow. If you vomit, rehydrate by drinking water, juice, or clear broth. General instructions If you have sleep apnea, surgery and certain medicines can increase your risk for breathing problems. Follow instructions from your health care provider about wearing your sleep device: Anytime you are sleeping, including during daytime naps. While taking prescription pain medicines, sleeping medicines, or medicines that make you drowsy. Have a responsible adult stay with you for the time you are told. It is important to have someone help care for you until you are awake and alert. Return to your normal activities as told by your health care provider. Ask your health care provider what activities are safe for you. Take over-the-counter and prescription medicines only as told by your health care provider. If you smoke, do not smoke without supervision. Keep all follow-up visits as told by your health care provider. This is important. Contact a health care provider if: You  have nausea or vomiting that does not get better with medicine. You cannot eat or drink without vomiting. You have pain that does not get better with medicine. You are unable to pass urine. You develop a skin rash. You have a fever. You have redness around your IV site that gets worse. Get help right away if: You have difficulty breathing. You have chest pain. You have blood in your urine or stool, or you vomit blood. Summary After the procedure, it is common to have a sore throat or nausea. It is also common to feel tired. Have a responsible adult stay with you for the time you are told. It is important to have someone help care for you until you are awake and alert. When you feel hungry, start by eating  small amounts of foods that are soft and easy to digest (bland), such as toast. Gradually return to your regular diet. Drink enough fluid to keep your urine pale yellow. Return to your normal activities as told by your health care provider. Ask your health care provider what activities are safe for you. This information is not intended to replace advice given to you by your health care provider. Make sure you discuss any questions you have with your healthcare provider. Document Revised: 01/08/2020 Document Reviewed: 08/07/2019 Elsevier Patient Education  2022 [employer].

## 2020-11-05 ENCOUNTER — Other Ambulatory Visit: Payer: Self-pay

## 2020-11-05 ENCOUNTER — Encounter (HOSPITAL_COMMUNITY)
Admission: RE | Admit: 2020-11-05 | Discharge: 2020-11-05 | Disposition: A | Discharge status: Home / Self Care | Payer: No Typology Code available for payment source | Source: Ambulatory Visit | Attending: General Surgery | Admitting: General Surgery

## 2020-11-05 NOTE — Progress Notes (Signed)
Jesus Adams; [id number]; August 12, 1985   HPI Patient is a 35 year old black male who was referred to my care by the emergency room and [name] for evaluation and treatment of bleeding internal hemorrhoids.  Patient has had a prolapsing internal hemorrhoid for many years.  He recently underwent a colonoscopy and had significant bleeding from the internal hemorrhoid.  It could not be banded.  The patient thus was referred to general surgery.  He was seen in the emergency room yesterday evening for rectal bleeding.  His hemoglobin was noted to be stable.  He states he has had 2 pushback given the prolapsing hemorrhoid for many years.  He has been on creams for this in the past.  He has had hemorrhoid surgery in the remote past.  He states the rectal bleeding has stopped. Past Medical History:  Diagnosis Date   Anxiety    Hemorrhoids    Migraine     Past Surgical History:  Procedure Laterality Date   HEMORRHOID SURGERY      History reviewed. No pertinent family history.  Current Outpatient Medications on File Prior to Visit  Medication Sig Dispense Refill   HYDROcodone-acetaminophen (NORCO/VICODIN) 5-325 MG tablet Take 1 tablet by mouth every 4 (four) hours as needed. (Patient taking differently: Take 1 tablet by mouth every 4 (four) hours as needed for moderate pain.) 6 tablet 0   phenylephrine (,USE FOR PREPARATION-H,) 0.25 % suppository Place 1 suppository rectally 2 (two) times daily. 12 suppository 0   No current facility-administered medications on file prior to visit.    Allergies  Allergen Reactions   Penicillins Other (See Comments)    seizure   Tramadol Hives and Other (See Comments)    Jittery     Social History   Substance and Sexual Activity  Alcohol Use No    Social History   Tobacco Use  Smoking Status Every Day   Packs/day: 1.00   Pack years: 0.00   Types: Cigarettes  Smokeless Tobacco Never    Review of Systems  Constitutional: Negative.   HENT:  Negative.    Eyes: Negative.   Respiratory: Negative.    Cardiovascular: Negative.   Gastrointestinal:  Positive for abdominal pain.  Genitourinary: Negative.   Musculoskeletal: Negative.   Skin: Negative.   Neurological: Negative.   Endo/Heme/Allergies: Negative.   Psychiatric/Behavioral: Negative.     Objective   Vitals:   11/04/20 1131  BP: 120/76  Pulse: 75  Resp: 14  Temp: 98.7 F (37.1 C)  SpO2: 96%    Physical Exam Vitals reviewed.  Constitutional:      Appearance: Normal appearance. He is normal weight. He is not ill-appearing.  HENT:     Head: Normocephalic and atraumatic.  Cardiovascular:     Rate and Rhythm: Normal rate and regular rhythm.     Heart sounds: Normal heart sounds. No murmur heard.   No friction rub. No gallop.  Pulmonary:     Effort: Pulmonary effort is normal. No respiratory distress.     Breath sounds: Normal breath sounds. No stridor. No wheezing, rhonchi or rales.  Abdominal:     General: Abdomen is flat. Bowel sounds are normal. There is no distension.     Palpations: Abdomen is soft. There is no mass.     Tenderness: There is no abdominal tenderness. There is no guarding.     Hernia: No hernia is present.  Genitourinary:    Comments: No external hemorrhoidal disease noted.  No blood noted.  Did not  digitally do rectal examination due to discomfort. Neurological:     Mental Status: He is alert and oriented to person, place, and time.   Colonoscopy and ER reports reviewed Assessment  Bleeding internal hemorrhoids Plan  Patient is scheduled for hemorrhoidectomy on 11/09/2020.  The risks and benefits of the procedure including bleeding, infection, and recurrence of the hemorrhoidal disease were fully explained to the patient, who gave informed consent. 

## 2020-11-05 NOTE — H&P (Signed)
Jesus Adams; [id number]; August 12, 1985   HPI Patient is a 35 year old black male who was referred to my care by the emergency room and Jesus Adams for evaluation and treatment of bleeding internal hemorrhoids.  Patient has had a prolapsing internal hemorrhoid for many years.  He recently underwent a colonoscopy and had significant bleeding from the internal hemorrhoid.  It could not be banded.  The patient thus was referred to general surgery.  He was seen in the emergency room yesterday evening for rectal bleeding.  His hemoglobin was noted to be stable.  He states he has had 2 pushback given the prolapsing hemorrhoid for many years.  He has been on creams for this in the past.  He has had hemorrhoid surgery in the remote past.  He states the rectal bleeding has stopped. Past Medical History:  Diagnosis Date   Anxiety    Hemorrhoids    Migraine     Past Surgical History:  Procedure Laterality Date   HEMORRHOID SURGERY      History reviewed. No pertinent family history.  Current Outpatient Medications on File Prior to Visit  Medication Sig Dispense Refill   HYDROcodone-acetaminophen (NORCO/VICODIN) 5-325 MG tablet Take 1 tablet by mouth every 4 (four) hours as needed. (Patient taking differently: Take 1 tablet by mouth every 4 (four) hours as needed for moderate pain.) 6 tablet 0   phenylephrine (,USE FOR PREPARATION-H,) 0.25 % suppository Place 1 suppository rectally 2 (two) times daily. 12 suppository 0   No current facility-administered medications on file prior to visit.    Allergies  Allergen Reactions   Penicillins Other (See Comments)    seizure   Tramadol Hives and Other (See Comments)    Jittery     Social History   Substance and Sexual Activity  Alcohol Use No    Social History   Tobacco Use  Smoking Status Every Day   Packs/day: 1.00   Pack years: 0.00   Types: Cigarettes  Smokeless Tobacco Never    Review of Systems  Constitutional: Negative.   HENT:  Negative.    Eyes: Negative.   Respiratory: Negative.    Cardiovascular: Negative.   Gastrointestinal:  Positive for abdominal pain.  Genitourinary: Negative.   Musculoskeletal: Negative.   Skin: Negative.   Neurological: Negative.   Endo/Heme/Allergies: Negative.   Psychiatric/Behavioral: Negative.     Objective   Vitals:   11/04/20 1131  BP: 120/76  Pulse: 75  Resp: 14  Temp: 98.7 F (37.1 C)  SpO2: 96%    Physical Exam Vitals reviewed.  Constitutional:      Appearance: Normal appearance. He is normal weight. He is not ill-appearing.  HENT:     Head: Normocephalic and atraumatic.  Cardiovascular:     Rate and Rhythm: Normal rate and regular rhythm.     Heart sounds: Normal heart sounds. No murmur heard.   No friction rub. No gallop.  Pulmonary:     Effort: Pulmonary effort is normal. No respiratory distress.     Breath sounds: Normal breath sounds. No stridor. No wheezing, rhonchi or rales.  Abdominal:     General: Abdomen is flat. Bowel sounds are normal. There is no distension.     Palpations: Abdomen is soft. There is no mass.     Tenderness: There is no abdominal tenderness. There is no guarding.     Hernia: No hernia is present.  Genitourinary:    Comments: No external hemorrhoidal disease noted.  No blood noted.  Did not  digitally do rectal examination due to discomfort. Neurological:     Mental Status: He is alert and oriented to person, place, and time.   Colonoscopy and ER reports reviewed Assessment  Bleeding internal hemorrhoids Plan  Patient is scheduled for hemorrhoidectomy on 11/09/2020.  The risks and benefits of the procedure including bleeding, infection, and recurrence of the hemorrhoidal disease were fully explained to the patient, who gave informed consent.

## 2020-11-09 ENCOUNTER — Encounter (HOSPITAL_COMMUNITY): Payer: Self-pay | Admitting: General Surgery

## 2020-11-09 ENCOUNTER — Ambulatory Visit (HOSPITAL_COMMUNITY): Payer: No Typology Code available for payment source | Admitting: Anesthesiology

## 2020-11-09 ENCOUNTER — Encounter (HOSPITAL_COMMUNITY)
Admission: RE | Disposition: A | Discharge status: Home / Self Care | Payer: Self-pay | Source: Home / Self Care | Attending: General Surgery

## 2020-11-09 ENCOUNTER — Ambulatory Visit (HOSPITAL_COMMUNITY)
Admission: RE | Admit: 2020-11-09 | Discharge: 2020-11-09 | Disposition: A | Discharge status: Home / Self Care | Payer: No Typology Code available for payment source | Attending: General Surgery | Admitting: General Surgery

## 2020-11-09 DIAGNOSIS — Z880 Allergy status to penicillin: Secondary | ICD-10-CM | POA: Insufficient documentation

## 2020-11-09 DIAGNOSIS — K644 Residual hemorrhoidal skin tags: Secondary | ICD-10-CM | POA: Insufficient documentation

## 2020-11-09 DIAGNOSIS — K648 Other hemorrhoids: Secondary | ICD-10-CM | POA: Insufficient documentation

## 2020-11-09 DIAGNOSIS — Z885 Allergy status to narcotic agent status: Secondary | ICD-10-CM | POA: Insufficient documentation

## 2020-11-09 DIAGNOSIS — K649 Unspecified hemorrhoids: Secondary | ICD-10-CM | POA: Diagnosis not present

## 2020-11-09 DIAGNOSIS — F17210 Nicotine dependence, cigarettes, uncomplicated: Secondary | ICD-10-CM | POA: Diagnosis not present

## 2020-11-09 HISTORY — PX: HEMORRHOID SURGERY: SHX153

## 2020-11-09 SURGERY — HEMORRHOIDECTOMY
Anesthesia: General

## 2020-11-09 MED ORDER — METRONIDAZOLE 500 MG/100ML IV SOLN
INTRAVENOUS | Status: AC
  Filled 2020-11-09: qty 100

## 2020-11-09 MED ORDER — SURGILUBE EX GEL
CUTANEOUS | Status: DC | PRN
  Administered 2020-11-09: 1 via TOPICAL

## 2020-11-09 MED ORDER — FENTANYL CITRATE (PF) 100 MCG/2ML IJ SOLN
INTRAMUSCULAR | Status: AC
  Filled 2020-11-09: qty 2

## 2020-11-09 MED ORDER — FENTANYL CITRATE (PF) 100 MCG/2ML IJ SOLN
INTRAMUSCULAR | Status: DC | PRN
  Administered 2020-11-09 (×4): 50 ug via INTRAVENOUS

## 2020-11-09 MED ORDER — OXYCODONE HCL 5 MG PO TABS
ORAL_TABLET | ORAL | Status: AC
  Filled 2020-11-09: qty 2

## 2020-11-09 MED ORDER — LIDOCAINE VISCOUS HCL 2 % MT SOLN
OROMUCOSAL | Status: DC | PRN
  Administered 2020-11-09: 1 via OROMUCOSAL

## 2020-11-09 MED ORDER — OXYCODONE HCL 5 MG PO TABS
10.0000 mg | ORAL_TABLET | Freq: Once | ORAL | Status: AC
  Administered 2020-11-09: 10 mg via ORAL

## 2020-11-09 MED ORDER — PROPOFOL 10 MG/ML IV BOLUS
INTRAVENOUS | Status: AC
  Filled 2020-11-09: qty 40

## 2020-11-09 MED ORDER — HYDROMORPHONE HCL 1 MG/ML IJ SOLN
INTRAMUSCULAR | Status: AC
  Filled 2020-11-09: qty 0.5

## 2020-11-09 MED ORDER — BUPIVACAINE LIPOSOME 1.3 % IJ SUSP
INTRAMUSCULAR | Status: DC | PRN
  Administered 2020-11-09: 14 mL

## 2020-11-09 MED ORDER — DEXAMETHASONE SODIUM PHOSPHATE 4 MG/ML IJ SOLN
INTRAMUSCULAR | Status: DC | PRN
  Administered 2020-11-09: 5 mg via INTRAVENOUS

## 2020-11-09 MED ORDER — CHLORHEXIDINE GLUCONATE 0.12 % MT SOLN: 15.0000 mL | Freq: Once | OROMUCOSAL | Status: AC

## 2020-11-09 MED ORDER — DEXAMETHASONE SODIUM PHOSPHATE 10 MG/ML IJ SOLN
INTRAMUSCULAR | Status: AC
  Filled 2020-11-09: qty 1

## 2020-11-09 MED ORDER — MEPERIDINE HCL 50 MG/ML IJ SOLN: 6.2500 mg | INTRAMUSCULAR | Status: DC | PRN

## 2020-11-09 MED ORDER — HEMOSTATIC AGENTS (NO CHARGE) OPTIME
TOPICAL | Status: DC | PRN
  Administered 2020-11-09: 1 via TOPICAL

## 2020-11-09 MED ORDER — LIDOCAINE 2% (20 MG/ML) 5 ML SYRINGE
INTRAMUSCULAR | Status: DC | PRN
  Administered 2020-11-09: 100 mg via INTRAVENOUS

## 2020-11-09 MED ORDER — LACTATED RINGERS IV SOLN: INTRAVENOUS | Status: DC

## 2020-11-09 MED ORDER — SODIUM CHLORIDE 0.9 % IR SOLN
Status: DC | PRN
  Administered 2020-11-09: 1000 mL

## 2020-11-09 MED ORDER — CHLORHEXIDINE GLUCONATE CLOTH 2 % EX PADS: 6.0000 | MEDICATED_PAD | Freq: Once | CUTANEOUS | Status: DC

## 2020-11-09 MED ORDER — GLYCOPYRROLATE PF 0.2 MG/ML IJ SOSY
PREFILLED_SYRINGE | INTRAMUSCULAR | Status: AC
  Filled 2020-11-09: qty 1

## 2020-11-09 MED ORDER — PROPOFOL 10 MG/ML IV BOLUS
INTRAVENOUS | Status: DC | PRN
  Administered 2020-11-09: 50 mg via INTRAVENOUS
  Administered 2020-11-09: 250 mg via INTRAVENOUS

## 2020-11-09 MED ORDER — MIDAZOLAM HCL 5 MG/5ML IJ SOLN
INTRAMUSCULAR | Status: DC | PRN
  Administered 2020-11-09: 2 mg via INTRAVENOUS

## 2020-11-09 MED ORDER — BUPIVACAINE LIPOSOME 1.3 % IJ SUSP
INTRAMUSCULAR | Status: AC
  Filled 2020-11-09: qty 20

## 2020-11-09 MED ORDER — KETOROLAC TROMETHAMINE 30 MG/ML IJ SOLN
INTRAMUSCULAR | Status: AC
  Filled 2020-11-09: qty 1

## 2020-11-09 MED ORDER — MIDAZOLAM HCL 2 MG/2ML IJ SOLN
INTRAMUSCULAR | Status: AC
  Filled 2020-11-09: qty 2

## 2020-11-09 MED ORDER — ORAL CARE MOUTH RINSE: 15.0000 mL | Freq: Once | OROMUCOSAL | Status: AC

## 2020-11-09 MED ORDER — HYDROCODONE-ACETAMINOPHEN 5-325 MG PO TABS: 1.0000 | ORAL_TABLET | ORAL | 0 refills | Status: DC | PRN

## 2020-11-09 MED ORDER — METRONIDAZOLE 500 MG/100ML IV SOLN
500.0000 mg | INTRAVENOUS | Status: AC
  Administered 2020-11-09: 500 mg via INTRAVENOUS

## 2020-11-09 MED ORDER — CHLORHEXIDINE GLUCONATE 0.12 % MT SOLN
OROMUCOSAL | Status: AC
  Administered 2020-11-09: 15 mL via OROMUCOSAL
  Filled 2020-11-09: qty 15

## 2020-11-09 MED ORDER — LIDOCAINE VISCOUS HCL 2 % MT SOLN
OROMUCOSAL | Status: AC
  Filled 2020-11-09: qty 15

## 2020-11-09 MED ORDER — MIDAZOLAM HCL 2 MG/2ML IJ SOLN
2.0000 mg | Freq: Once | INTRAMUSCULAR | Status: AC
  Administered 2020-11-09: 2 mg via INTRAVENOUS

## 2020-11-09 MED ORDER — ONDANSETRON HCL 4 MG/2ML IJ SOLN
INTRAMUSCULAR | Status: AC
  Filled 2020-11-09: qty 2

## 2020-11-09 MED ORDER — HYDROMORPHONE HCL 1 MG/ML IJ SOLN
0.5000 mg | INTRAMUSCULAR | Status: DC | PRN
  Administered 2020-11-09 (×2): 0.5 mg via INTRAVENOUS

## 2020-11-09 MED ORDER — PROMETHAZINE HCL 25 MG/ML IJ SOLN: 6.2500 mg | INTRAMUSCULAR | Status: DC | PRN

## 2020-11-09 MED ORDER — KETOROLAC TROMETHAMINE 30 MG/ML IJ SOLN
INTRAMUSCULAR | Status: DC | PRN
  Administered 2020-11-09: 30 mg via INTRAVENOUS

## 2020-11-09 MED ORDER — ONDANSETRON HCL 4 MG/2ML IJ SOLN
INTRAMUSCULAR | Status: DC | PRN
  Administered 2020-11-09: 4 mg via INTRAVENOUS

## 2020-11-09 MED ORDER — GLYCOPYRROLATE PF 0.2 MG/ML IJ SOSY
PREFILLED_SYRINGE | INTRAMUSCULAR | Status: DC | PRN
  Administered 2020-11-09 (×2): .1 mg via INTRAVENOUS

## 2020-11-09 MED ORDER — FENTANYL CITRATE (PF) 100 MCG/2ML IJ SOLN
25.0000 ug | INTRAMUSCULAR | Status: DC | PRN
  Administered 2020-11-09 (×4): 50 ug via INTRAVENOUS

## 2020-11-09 MED ORDER — LIDOCAINE HCL (PF) 2 % IJ SOLN
INTRAMUSCULAR | Status: AC
  Filled 2020-11-09: qty 5

## 2020-11-09 SURGICAL SUPPLY — 27 items
CLOTH BEACON ORANGE TIMEOUT ST (SAFETY) ×2 IMPLANT
COVER LIGHT HANDLE STERIS (MISCELLANEOUS) ×4 IMPLANT
DRAPE HALF SHEET 40X57 (DRAPES) ×2 IMPLANT
DRSG TEGADERM 4X10 (GAUZE/BANDAGES/DRESSINGS) ×2 IMPLANT
ELECT REM PT RETURN 9FT ADLT (ELECTROSURGICAL) ×2
ELECTRODE REM PT RTRN 9FT ADLT (ELECTROSURGICAL) ×1 IMPLANT
GAUZE 4X4 16PLY ~~LOC~~+RFID DBL (SPONGE) ×2 IMPLANT
GAUZE SPONGE 4X4 12PLY STRL (GAUZE/BANDAGES/DRESSINGS) ×2 IMPLANT
GLOVE SURG SS PI 7.5 STRL IVOR (GLOVE) ×2 IMPLANT
GLOVE SURG UNDER POLY LF SZ7 (GLOVE) ×4 IMPLANT
GOWN STRL REUS W/TWL LRG LVL3 (GOWN DISPOSABLE) ×4 IMPLANT
HEMOSTAT SURGICEL 4X8 (HEMOSTASIS) ×2 IMPLANT
KIT TURNOVER CYSTO (KITS) ×2 IMPLANT
LIGASURE IMPACT 36 18CM CVD LR (INSTRUMENTS) ×2 IMPLANT
MANIFOLD NEPTUNE II (INSTRUMENTS) ×2 IMPLANT
NEEDLE HYPO 18GX1.5 BLUNT FILL (NEEDLE) ×2 IMPLANT
NEEDLE HYPO 21X1.5 SAFETY (NEEDLE) ×2 IMPLANT
NS IRRIG 1000ML POUR BTL (IV SOLUTION) ×2 IMPLANT
PACK PERI GYN (CUSTOM PROCEDURE TRAY) ×2 IMPLANT
PAD ARMBOARD 7.5X6 YLW CONV (MISCELLANEOUS) ×2 IMPLANT
PENCIL SMOKE EVACUATOR (MISCELLANEOUS) ×2 IMPLANT
SET BASIN LINEN APH (SET/KITS/TRAYS/PACK) ×2 IMPLANT
SPONGE GAUZE 4X4 12PLY (GAUZE/BANDAGES/DRESSINGS) ×2 IMPLANT
SURGILUBE 2OZ TUBE FLIPTOP (MISCELLANEOUS) ×2 IMPLANT
SUT SILK 0 FSL (SUTURE) ×2 IMPLANT
SUT VIC AB 2-0 CT2 27 (SUTURE) ×2 IMPLANT
SYR 20ML LL LF (SYRINGE) ×4 IMPLANT

## 2020-11-09 NOTE — Op Note (Signed)
Patient:  Jesus Adams  DOB:  October 09, 1985  MRN:  [id number]   Preop Diagnosis: Bleeding hemorrhoid disease  Postop Diagnosis: Same, superficial mucosal prolapse  Procedure: Extensive hemorrhoidectomy  Surgeon: Franky Macho, MD  Anes: General  Indications: Patient is a 35 year old black male with a longstanding history of intermittent rectal bleeding from hemorrhoidal disease who recently underwent colonoscopy and was found to have significant prolapsing internal hemorrhoids which intermittently bleed.  He has had a hemorrhoidectomy in the remote past.  The risks and benefits of the procedure including bleeding, infection, and the possibility of recurrence of the hemorrhoidal disease were fully explained to the patient, who gave informed consent.  Procedure note: The patient was placed in the lithotomy position after general anesthesia was administered.  The perineum was prepped and draped using the usual sterile technique with Betadine.  Surgical site confirmation was performed.  On rectal examination, the patient had internal and external hemorrhoidal disease at the 4 and 7:00 positions.  In addition, the patient had prominent mucosal prolapse circumferentially, though specifically more along the left side of the anus.  The external sphincter mechanism was lax.  No active bleeding was noted.  Using the LigaSure, I did excise the hemorrhoids at the 4 and 7:00 positions.  No bleeding was noted at the end of the procedure.  Exparel was instilled into the surrounding perineum.  Surgicel and viscous Xylocaine rectal packing was then placed.  All tape and needle counts were correct at the end of the procedure.  The patient was awakened and transferred to PACU in stable condition.  Complications: None  EBL: Minimal  Specimen: Hemorrhoids

## 2020-11-09 NOTE — Anesthesia Procedure Notes (Signed)
Procedure Name: LMA Insertion Date/Time: 11/09/2020 7:35 AM Performed by: Julian Reil, CRNA Pre-anesthesia Checklist: Patient identified, Emergency Drugs available, Suction available and Patient being monitored Patient Re-evaluated:Patient Re-evaluated prior to induction Oxygen Delivery Method: Circle system utilized Preoxygenation: Pre-oxygenation with 100% oxygen Induction Type: IV induction LMA: LMA inserted LMA Size: 4.0 Tube type: Oral Number of attempts: 1 Placement Confirmation: positive ETCO2 Tube secured with: Tape Dental Injury: Teeth and Oropharynx as per pre-operative assessment

## 2020-11-09 NOTE — Anesthesia Preprocedure Evaluation (Signed)
Anesthesia Evaluation  Patient identified by MRN, date of birth, ID band Patient awake    Reviewed: Allergy & Precautions, NPO status , Patient's Chart, lab work & pertinent test results  Airway Mallampati: II  TM Distance: >3 FB Neck ROM: Full    Dental  (+) Dental Advisory Given, Chipped,    Pulmonary Current SmokerPatient did not abstain from smoking.,    Pulmonary exam normal breath sounds clear to auscultation       Cardiovascular Exercise Tolerance: Good Normal cardiovascular exam Rhythm:Regular Rate:Normal     Neuro/Psych  Headaches, Anxiety    GI/Hepatic GERD (mild)  Controlled,(+)     substance abuse  alcohol use and marijuana use,   Endo/Other  negative endocrine ROS  Renal/GU negative Renal ROS     Musculoskeletal negative musculoskeletal ROS (+)   Abdominal   Peds  Hematology negative hematology ROS (+)   Anesthesia Other Findings   Reproductive/Obstetrics negative OB ROS                            Anesthesia Physical Anesthesia Plan  ASA: 2  Anesthesia Plan: General   Post-op Pain Management:    Induction: Intravenous  PONV Risk Score and Plan: 2 and Ondansetron, Dexamethasone and Midazolam  Airway Management Planned: LMA  Additional Equipment:   Intra-op Plan:   Post-operative Plan: Extubation in OR  Informed Consent: I have reviewed the patients History and Physical, chart, labs and discussed the procedure including the risks, benefits and alternatives for the proposed anesthesia with the patient or authorized representative who has indicated his/her understanding and acceptance.     Dental advisory given  Plan Discussed with: CRNA and Surgeon  Anesthesia Plan Comments:         Anesthesia Quick Evaluation

## 2020-11-09 NOTE — Anesthesia Postprocedure Evaluation (Signed)
Anesthesia Post Note  Patient: Jesus Adams  Procedure(s) Performed: HEMORRHOIDECTOMY; EXTENSIVE  Patient location during evaluation: PACU Anesthesia Type: General Level of consciousness: awake and alert and oriented Pain management: satisfactory to patient Vital Signs Assessment: post-procedure vital signs reviewed and stable Respiratory status: spontaneous breathing and respiratory function stable Cardiovascular status: blood pressure returned to baseline and stable Postop Assessment: no apparent nausea or vomiting Anesthetic complications: no   No notable events documented.   Last Vitals:  Vitals:   11/09/20 1002 11/09/20 1008  BP: 120/90 (!) 142/98  Pulse: 68 61  Resp:  16  Temp:    SpO2: 96% 99%    Last Pain:  Vitals:   11/09/20 1008  TempSrc:   PainSc: 5                  Lavarius Doughten C Tavion Senkbeil

## 2020-11-09 NOTE — Interval H&P Note (Signed)
History and Physical Interval Note:  11/09/2020 7:18 AM  Jesus Adams  has presented today for surgery, with the diagnosis of Bleeding internal hemorrhoids.  The various methods of treatment have been discussed with the patient and family. After consideration of risks, benefits and other options for treatment, the patient has consented to  Procedure(s): HEMORRHOIDECTOMY; EXTENSIVE (N/A) as a surgical intervention.  The patient's history has been reviewed, patient examined, no change in status, stable for surgery.  I have reviewed the patient's chart and labs.  Questions were answered to the patient's satisfaction.     Franky Macho

## 2020-11-09 NOTE — Transfer of Care (Signed)
Immediate Anesthesia Transfer of Care Note  Patient: Jesus Adams  Procedure(s) Performed: HEMORRHOIDECTOMY; EXTENSIVE  Patient Location: PACU  Anesthesia Type:General  Level of Consciousness: drowsy  Airway & Oxygen Therapy: Patient Spontanous Breathing and Patient connected to face mask oxygen  Post-op Assessment: Report given to RN and Post -op Vital signs reviewed and stable  Post vital signs: Reviewed and stable  Last Vitals:  Vitals Value Taken Time  BP 148/103 11/09/20 0824  Temp    Pulse 51 11/09/20 0824  Resp 15 11/09/20 0824  SpO2 100 % 11/09/20 0824  Vitals shown include unvalidated device data.  Last Pain:  Vitals:   11/09/20 0657  TempSrc: Oral  PainSc: 0-No pain      Patients Stated Pain Goal: 5 (11/09/20 0657)  Complications: No notable events documented.

## 2020-11-10 ENCOUNTER — Encounter (HOSPITAL_COMMUNITY): Payer: Self-pay | Admitting: General Surgery

## 2020-11-10 ENCOUNTER — Other Ambulatory Visit (INDEPENDENT_AMBULATORY_CARE_PROVIDER_SITE_OTHER): Payer: No Typology Code available for payment source | Admitting: General Surgery

## 2020-11-10 DIAGNOSIS — Z09 Encounter for follow-up examination after completed treatment for conditions other than malignant neoplasm: Secondary | ICD-10-CM

## 2020-11-10 MED ORDER — OXYCODONE-ACETAMINOPHEN 10-325 MG PO TABS: 1.0000 | ORAL_TABLET | Freq: Four times a day (QID) | ORAL | 0 refills | Status: DC | PRN

## 2020-11-10 NOTE — Progress Notes (Signed)
Hydrocodone ineffective.  Stop hydrocodone, switch to Percocet 10mg .

## 2020-11-11 LAB — SURGICAL PATHOLOGY

## 2020-11-16 ENCOUNTER — Encounter: Payer: Self-pay | Admitting: General Surgery

## 2020-11-16 ENCOUNTER — Other Ambulatory Visit: Payer: Self-pay

## 2020-11-16 ENCOUNTER — Ambulatory Visit (INDEPENDENT_AMBULATORY_CARE_PROVIDER_SITE_OTHER): Payer: No Typology Code available for payment source | Admitting: General Surgery

## 2020-11-16 VITALS — BP 116/84 | HR 88 | Temp 98.4°F | Resp 16 | Ht 65.0 in | Wt 122.0 lb

## 2020-11-16 DIAGNOSIS — Z09 Encounter for follow-up examination after completed treatment for conditions other than malignant neoplasm: Secondary | ICD-10-CM

## 2020-11-16 MED ORDER — OXYCODONE-ACETAMINOPHEN 10-325 MG PO TABS: 1.0000 | ORAL_TABLET | Freq: Four times a day (QID) | ORAL | 0 refills | Status: AC | PRN

## 2020-11-17 NOTE — Progress Notes (Signed)
Subjective:     Jesus Adams  Patient presents for follow-up, status post extensive hemorrhoidectomy.  He has had minimal blood per rectum after bowel movements.  He does have ongoing pain which is somewhat less but still requiring narcotics.  He denies any incontinence.  He is doing his sitz baths and hemorrhoidal creams to the outside of the anus. Objective:    BP 116/84   Pulse 88   Temp 98.4 F (36.9 C) (Other (Comment))   Resp 16   Ht 5\' 5"  (1.651 m)   Wt 122 lb (55.3 kg)   SpO2 94%   BMI 20.30 kg/m   General:  alert, cooperative, and no distress  External rectal examination reveals healing tissue with minimal swelling present.  No frank blood noted. Final pathology consistent with diagnosis.     Assessment:    Doing as expected after his extensive hemorrhoidectomy.    Plan:   I have reordered his Percocet for pain.  He was told to continue the sitz baths and comfort measures for his rectum.  I will see him again in follow-up in 3 weeks.  He was told to supplement the Percocet with ibuprofen.

## 2020-11-30 ENCOUNTER — Encounter: Payer: No Typology Code available for payment source | Admitting: General Surgery

## 2020-11-30 SURGERY — Surgical Case
Anesthesia: *Unknown

## 2022-03-20 IMAGING — DX DG ABDOMEN ACUTE W/ 1V CHEST
4 series · 4 of 4 positions shown · non-contrast
Comparison: None.

CLINICAL DATA: Colonoscopy, abdominal pain

EXAM:
DG ABDOMEN ACUTE WITH 1 VIEW CHEST

[chest pa]
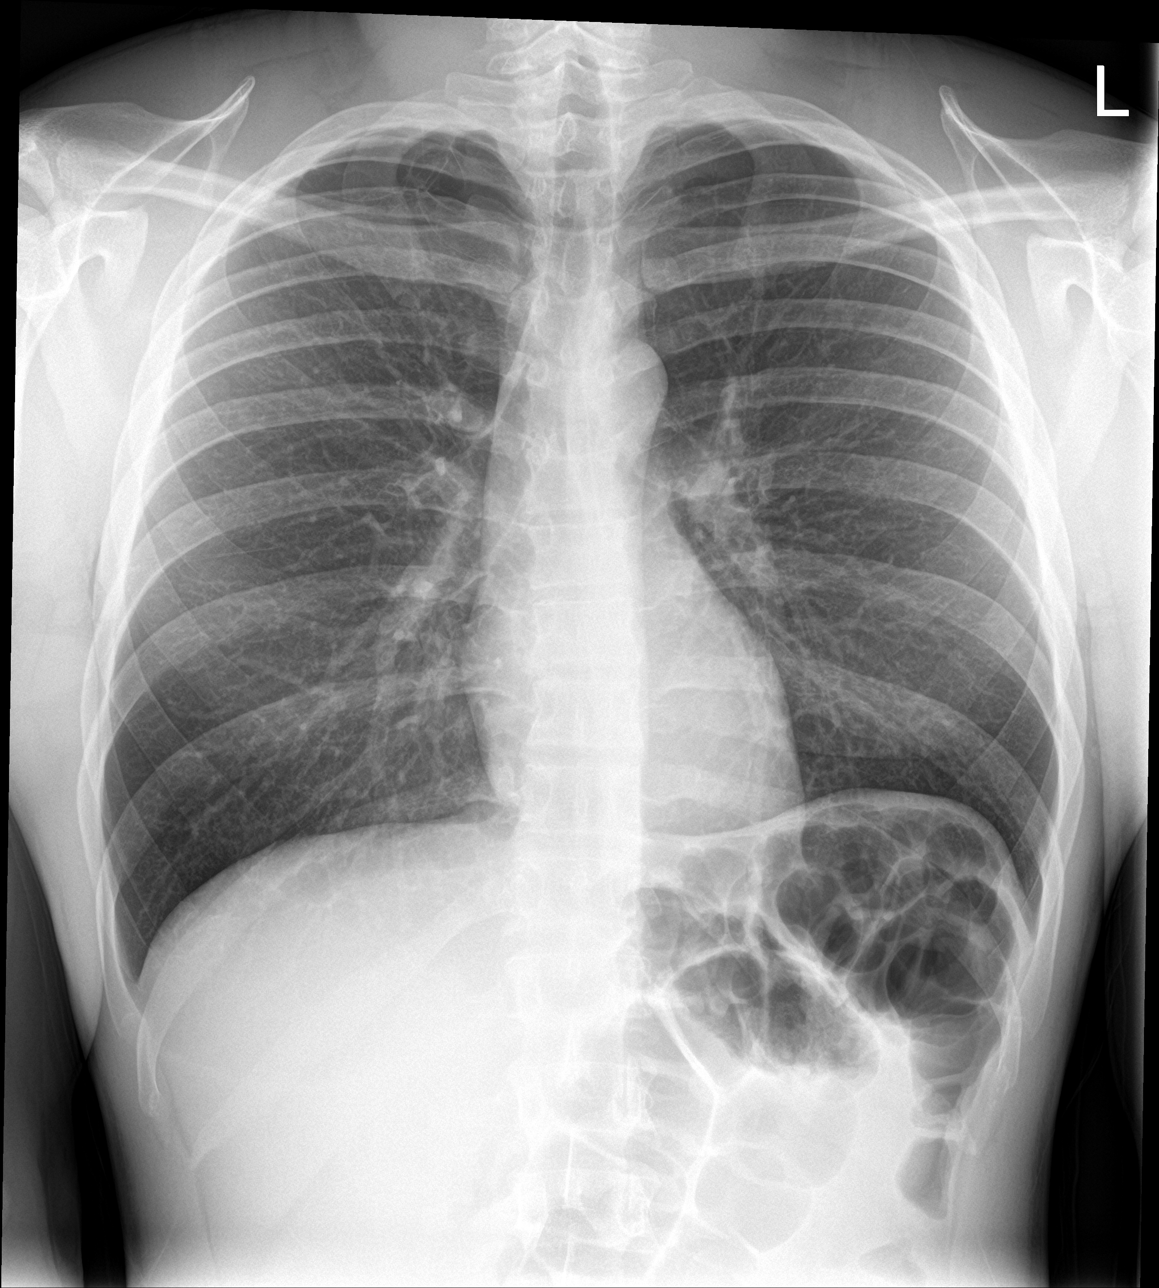

[abdomen erect]
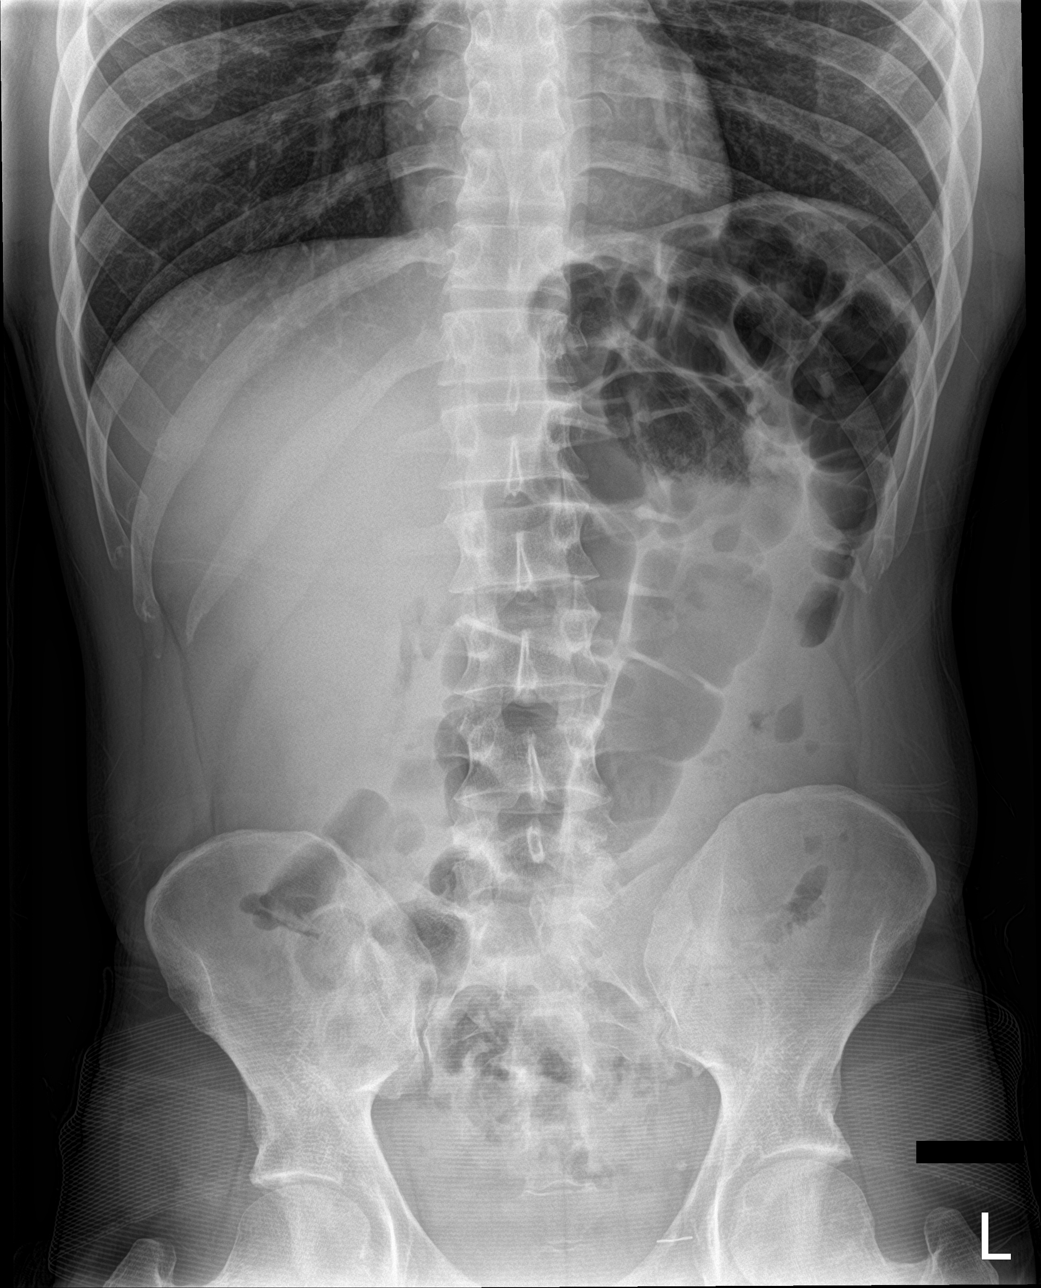

[abdomen supine (1 of 2)]
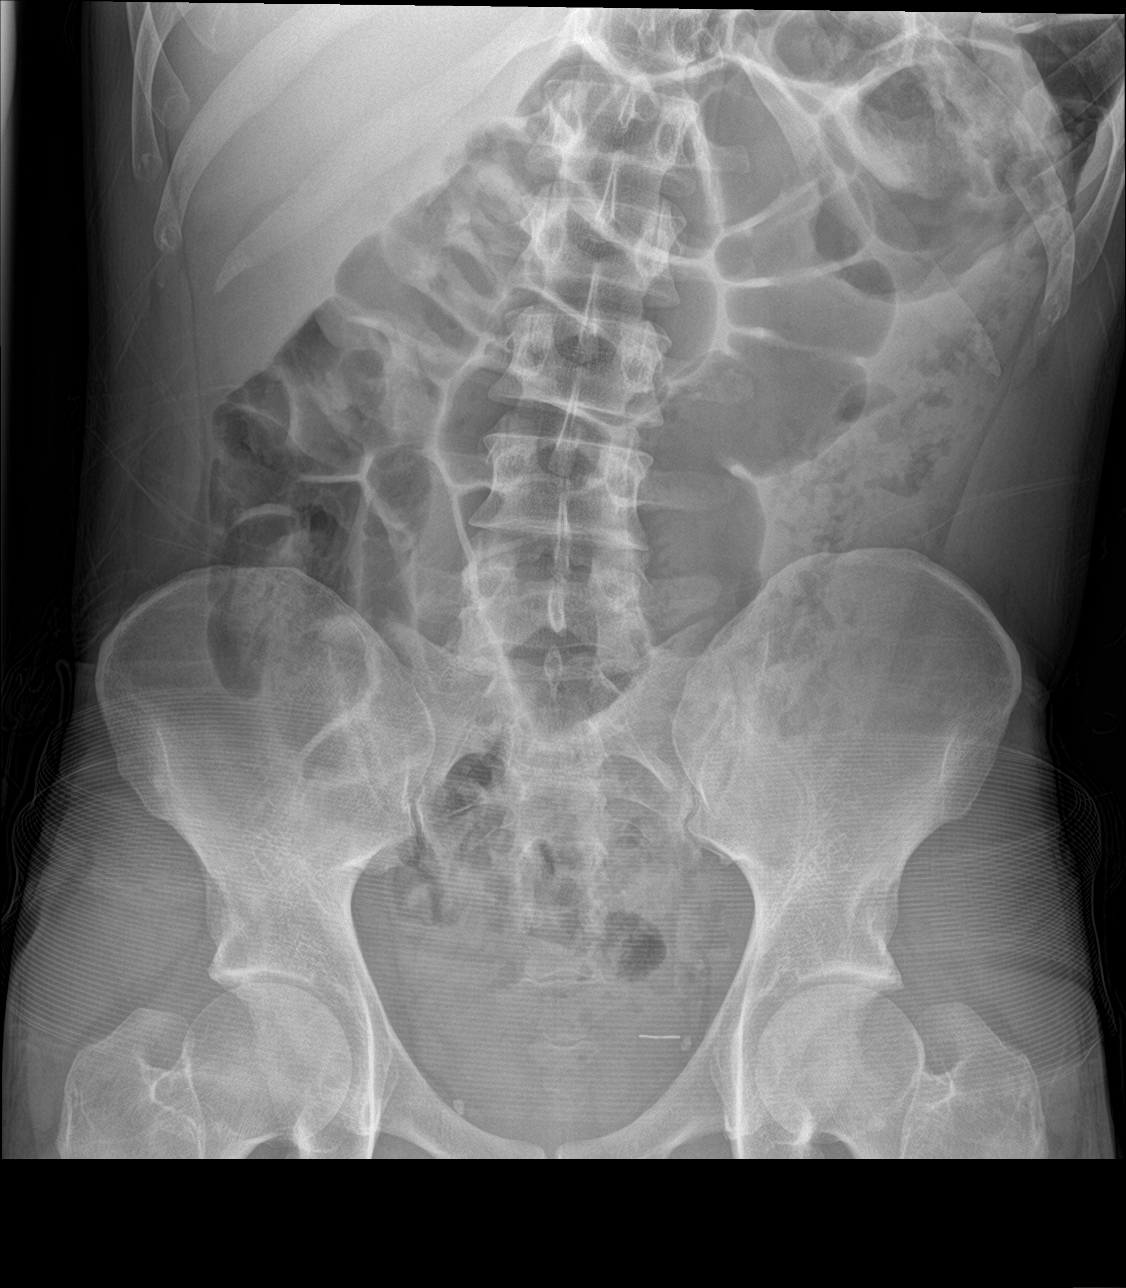

[abdomen supine (2 of 2)]
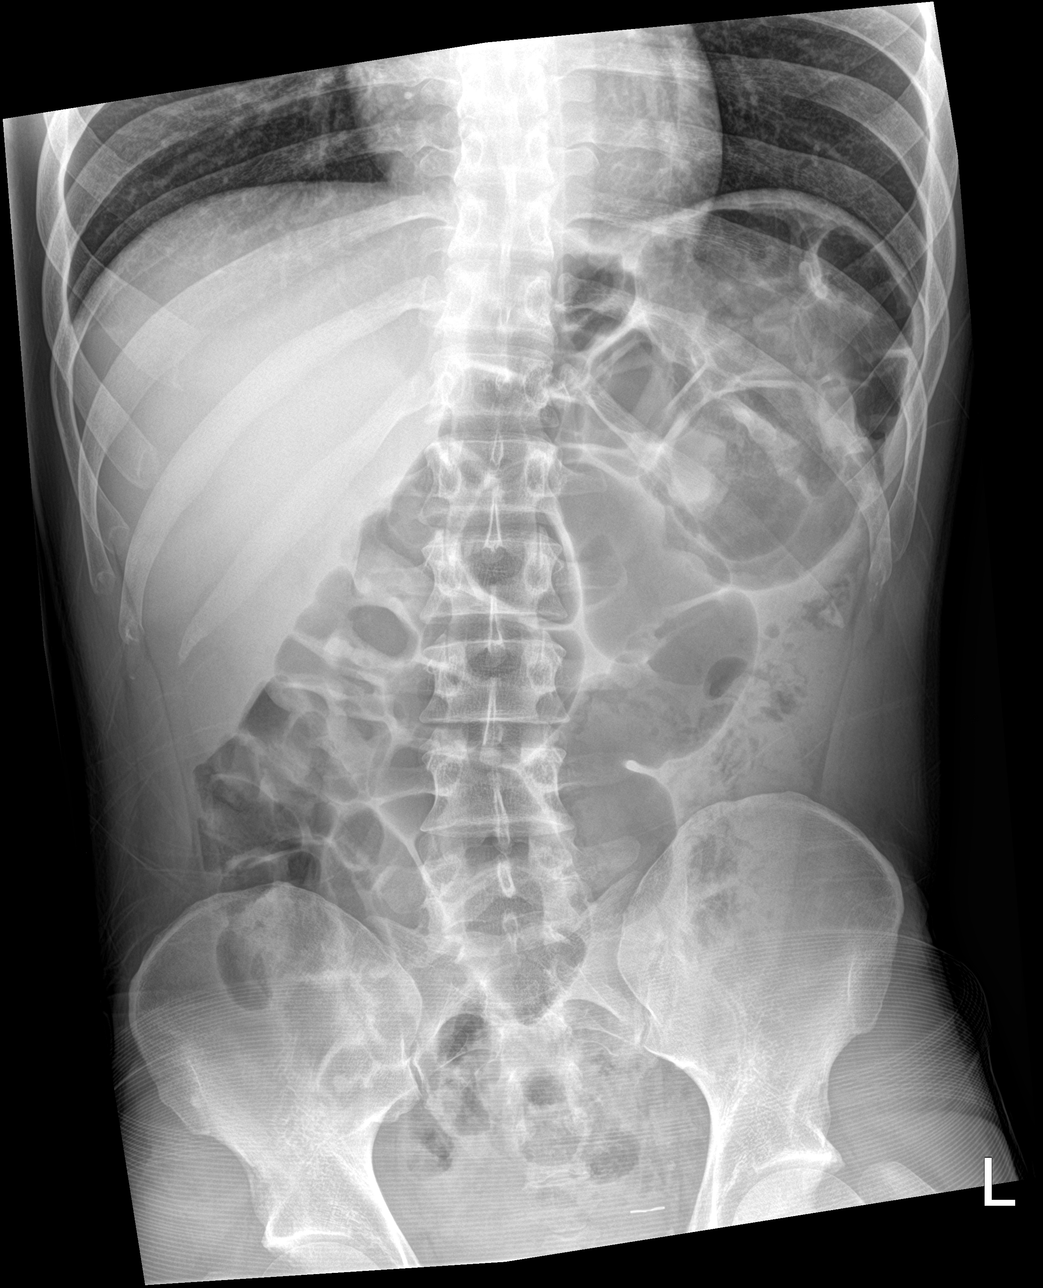

[4 of 4 positions shown; findings below may reference images not displayed]

FINDINGS: Gaseous distention of the colon. No organomegaly or free air. No
suspicious calcification.

Heart and mediastinal contours are within normal limits. No focal
opacities or effusions. No acute bony abnormality.
IMPRESSION: Gaseous distention of the colon, likely related to colonoscopy. No
free air.

No acute cardiopulmonary disease.
